# Patient Record
Sex: Male | Born: 1980 | Race: Black or African American | Hispanic: No | Marital: Married | Smoking: Never smoker
Health system: Southern US, Community
[De-identification: ages and names within clinical notes are randomized; demographics above are authoritative.]

## PROBLEM LIST (undated history)

## (undated) DIAGNOSIS — I10 Essential (primary) hypertension: Secondary | ICD-10-CM

## (undated) DIAGNOSIS — R12 Heartburn: Secondary | ICD-10-CM

## (undated) HISTORY — DX: Essential (primary) hypertension: I10

## (undated) HISTORY — DX: Heartburn: R12

## (undated) HISTORY — PX: BUNIONECTOMY: SHX129

## (undated) HISTORY — PX: SHOULDER SURGERY: SHX246

---

## 2000-11-28 ENCOUNTER — Emergency Department (HOSPITAL_COMMUNITY): Admission: EM | Admit: 2000-11-28 | Discharge: 2000-11-28 | Payer: Self-pay | Admitting: *Deleted

## 2011-10-10 ENCOUNTER — Emergency Department (HOSPITAL_COMMUNITY)
Admission: EM | Admit: 2011-10-10 | Discharge: 2011-10-10 | Disposition: A | Discharge status: Home / Self Care | Payer: PRIVATE HEALTH INSURANCE | Attending: Emergency Medicine | Admitting: Emergency Medicine

## 2011-10-10 ENCOUNTER — Encounter (HOSPITAL_COMMUNITY): Payer: Self-pay | Admitting: Emergency Medicine

## 2011-10-10 DIAGNOSIS — R197 Diarrhea, unspecified: Secondary | ICD-10-CM

## 2011-10-10 MED ORDER — DIPHENOXYLATE-ATROPINE 2.5-0.025 MG PO TABS
2.0000 | ORAL_TABLET | Freq: Once | ORAL | Status: AC
  Administered 2011-10-10: 2 via ORAL
  Filled 2011-10-10: qty 2

## 2011-10-10 NOTE — Discharge Instructions (Signed)
Drink fluids. Bland diet for the next 6-8 hours then progress as tolerated. Use the BRAT diet to help with the diarrhea.   B.R.A.T. Diet Your doctor has recommended the B.R.A.T. diet for you or your child until the condition improves. This is often used to help control diarrhea and vomiting symptoms. If you or your child can tolerate clear liquids, you may have:  Bananas.   Rice.   Applesauce.   Toast (and other simple starches such as crackers, potatoes, noodles).  Be sure to avoid dairy products, meats, and fatty foods until symptoms are better. Fruit juices such as apple, grape, and prune juice can make diarrhea worse. Avoid these. Continue this diet for 2 days or as instructed by your caregiver. Document Released: 04/04/2005 Document Revised: 03/24/2011 Document Reviewed: 09/21/2006 [facility] Patient Information 2012 [place], [place].

## 2011-10-10 NOTE — ED Notes (Signed)
States has been feeling bad since Thursday, started having diarrhea on Friday.

## 2011-10-10 NOTE — ED Provider Notes (Signed)
History     CSN: [id number]  Arrival date & time 10/10/11  4098   First MD Initiated Contact with Patient 10/10/11 [phone]      Chief Complaint  Patient presents with  . Diarrhea  . Abdominal Cramping    (Consider location/radiation/quality/duration/timing/severity/associated sxs/prior treatment) HPI  Sean Kennedy is a 31 y.o. male who presents to the Emergency Department complaining of abdominal cramping and general malaise since Thursday with the development of diarrhea on Friday. He's been having diarrhea since. He denies nausea, vomiting, fever, chills. He has been able to drink fluids. He has been eating however when he eats he has diarrhea. He has taken Imodium and Pepto-Bismol with no effect.  History reviewed. No pertinent past medical history.  History reviewed. No pertinent past surgical history.  No family history on file.  History  Substance Use Topics  . Smoking status: Never Smoker   . Smokeless tobacco: Not on file  . Alcohol Use: Yes     occasional      Review of Systems  Constitutional: Negative for fever.       10 Systems reviewed and are negative for acute change except as noted in the HPI.  HENT: Negative for congestion.   Eyes: Negative for discharge and redness.  Respiratory: Negative for cough and shortness of breath.   Cardiovascular: Negative for chest pain.  Gastrointestinal: Positive for diarrhea. Negative for vomiting and abdominal pain.  Musculoskeletal: Negative for back pain.  Skin: Negative for rash.  Neurological: Negative for syncope, numbness and headaches.  Psychiatric/Behavioral:       No behavior change.    Allergies  Review of patient's allergies indicates no known allergies.  Home Medications  No current outpatient prescriptions on file.  BP 128/68  Pulse 61  Temp 98.4 F (36.9 C) (Oral)  Resp 14  Ht 6' (1.829 m)  Wt 200 lb (90.719 kg)  BMI 27.12 kg/m2  SpO2 98%  Physical Exam  Nursing note and vitals  reviewed. Constitutional: He is oriented to person, place, and time. He appears well-developed and well-nourished. No distress.       Awake, alert, nontoxic appearance.  HENT:  Head: Normocephalic and atraumatic.  Eyes: EOM are normal. Pupils are equal, round, and reactive to light. Right eye exhibits no discharge. Left eye exhibits no discharge.  Neck: Neck supple.  Cardiovascular: Normal rate, normal heart sounds and intact distal pulses.   Pulmonary/Chest: Effort normal. He exhibits no tenderness.  Abdominal: Soft. He exhibits no distension. There is no tenderness. There is no rebound.       Bowel sounds hyperactive  Musculoskeletal: He exhibits no tenderness.       Baseline ROM, no obvious new focal weakness.  Neurological: He is alert and oriented to person, place, and time.       Mental status and motor strength appears baseline for patient and situation.  Skin: No rash noted.  Psychiatric: He has a normal mood and affect.    ED Course  Procedures (including critical care time)    MDM  Patient is a diarrhea illness since Friday. Given Lomotil. Given information about the BRAT diet. Pt stable in ED with no significant deterioration in condition.The patient appears reasonably screened and/or stabilized for discharge and I doubt any other medical condition or other [facility] requiring further screening, evaluation, or treatment in the ED at this time prior to discharge.  MDM Reviewed: nursing note and vitals  Nicoletta Dress. Colon Branch, MD 10/10/11 [phone]

## 2019-02-28 ENCOUNTER — Ambulatory Visit: Payer: Self-pay | Admitting: Physician Assistant

## 2019-03-11 ENCOUNTER — Encounter: Payer: Self-pay | Admitting: Physician Assistant

## 2019-03-11 ENCOUNTER — Ambulatory Visit (INDEPENDENT_AMBULATORY_CARE_PROVIDER_SITE_OTHER): Payer: PRIVATE HEALTH INSURANCE | Admitting: Physician Assistant

## 2019-03-11 ENCOUNTER — Other Ambulatory Visit: Payer: Self-pay

## 2019-03-11 VITALS — BP 161/110 | HR 83 | Temp 96.9°F | Ht 71.0 in | Wt 250.6 lb

## 2019-03-11 DIAGNOSIS — Z0000 Encounter for general adult medical examination without abnormal findings: Secondary | ICD-10-CM

## 2019-03-11 DIAGNOSIS — R030 Elevated blood-pressure reading, without diagnosis of hypertension: Secondary | ICD-10-CM | POA: Diagnosis not present

## 2019-03-11 DIAGNOSIS — Z23 Encounter for immunization: Secondary | ICD-10-CM | POA: Diagnosis not present

## 2019-03-11 NOTE — Progress Notes (Signed)
Patient: Sean Kennedy, Male    DOB: 04/16/1981, 38 y.o.   MRN: [id number] Visit Date: 03/11/2019  Today's Provider: Trinna Post, PA-C   Chief Complaint  Patient presents with  . New Patient (Initial Visit)   Subjective:     New Patient Appointment Sean Kennedy is a 38 y.o. male who presents today for health maintenance and complete physical/ new patient appointment. He feels well. He reports exercising not now. He reports he is sleeping fairly well. Lives with fiance, 2 kids one on the way  Alcohol every other weekend  Drugs: marijuana previously.   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  Mom, dad, and sister diagnosed with HTN. Sister is aged 18 and has had HTN x several years. He reports he has never been diagnosed with HTN previously.   Obesity: Patient reports weighing 215 lbs last year.   Wt Readings from Last 3 Encounters:  03/11/19 250 lb 9.6 oz (113.7 kg)  10/10/11 200 lb (90.7 kg)   BP Readings from Last 3 Encounters:  03/11/19 (!) 161/110  10/10/11 128/68     -----------------------------------------------------------------   Review of Systems  Constitutional: Negative.   HENT: Negative.   Eyes: Negative.   Respiratory: Negative.   Cardiovascular: Negative.   Gastrointestinal: Negative.   Endocrine: Negative.   Genitourinary: Negative.   Musculoskeletal: Negative.   Skin: Negative.   Allergic/Immunologic: Negative.   Neurological: Negative.   Hematological: Negative.   Psychiatric/Behavioral: Negative.     Social History He  reports that he has never smoked. He has never used smokeless tobacco. He reports current alcohol use. He reports that he does not use drugs. Social History   Socioeconomic History  . Marital status: Single    Spouse name: Not on file  . Number of children: Not on file  . Years of education: Not on file  . Highest education level: Not on file  Occupational History  . Not on file  Social Needs  . Financial  resource strain: Not on file  . Food insecurity    Worry: Not on file    Inability: Not on file  . Transportation needs    Medical: Not on file    Non-medical: Not on file  Tobacco Use  . Smoking status: Never Smoker  . Smokeless tobacco: Never Used  Substance and Sexual Activity  . Alcohol use: Yes    Comment: occasional  . Drug use: Never  . Sexual activity: Not on file  Lifestyle  . Physical activity    Days per week: Not on file    Minutes per session: Not on file  . Stress: Not on file  Relationships  . Social Herbalist on phone: Not on file    Gets together: Not on file    Attends religious service: Not on file    Active member of club or organization: Not on file    Attends meetings of clubs or organizations: Not on file    Relationship status: Not on file  Other Topics Concern  . Not on file  Social History Narrative  . Not on file    There are no active problems to display for this patient.   Past Surgical History:  Procedure Laterality Date  . SHOULDER SURGERY Right     Family History  Family Status  Relation Name Status  . Mother  (Not Specified)  . Father  (Not Specified)  . Sister  (Not Specified)  His family history includes Hypertension in his father, mother, and sister.     No Known Allergies  Previous Medications   No medications on file    Patient Care Team: Maryella Shivers as PCP - General (Physician Assistant)      Objective:   Vitals: BP (!) 161/110 (BP Location: Left Arm, Patient Position: Sitting, Cuff Size: Large)   Pulse 83   Temp (!) 96.9 F (36.1 C) (Temporal)   Ht 5\' 11"  (1.803 m)   Wt 250 lb 9.6 oz (113.7 kg)   BMI 34.95 kg/m    Physical Exam Constitutional:      Appearance: Normal appearance. He is obese.  Cardiovascular:     Rate and Rhythm: Normal rate.     Heart sounds: Normal heart sounds.  Pulmonary:     Effort: Pulmonary effort is normal.     Breath sounds: Normal breath sounds.   Abdominal:     General: Abdomen is flat. Bowel sounds are normal.     Palpations: Abdomen is soft.  Skin:    General: Skin is warm and dry.  Neurological:     Mental Status: He is alert and oriented to person, place, and time. Mental status is at baseline.  Psychiatric:        Mood and Affect: Mood normal.        Behavior: Behavior normal.      Depression Screen PHQ 2/9 Scores 03/11/2019  PHQ - 2 Score 0  PHQ- 9 Score 2      Assessment & Plan:     Routine Health Maintenance and Physical Exam  Exercise Activities and Dietary recommendations Goals   None      There is no immunization history on file for this patient.  Health Maintenance  Topic Date Due  . HIV Screening  01/01/1996  . TETANUS/TDAP  01/01/2000  . INFLUENZA VACCINE  07/17/2019 (Originally 11/17/2018)     Discussed health benefits of physical activity, and encouraged him to engage in regular exercise appropriate for his age and condition.    1. Annual physical exam  Will update tetanus shot today.   - Comprehensive Metabolic Panel (CMET) - CBC with Differential - TSH - Lipid Profile  2. Elevated BP without diagnosis of hypertension  Likely represents HTN. Patient wants to come back in separate visit and see if it is still elevated before starting medication. Counseled on lifestyle changes. Follow up in 1-2 months.   The entirety of the information documented in the History of Present Illness, Review of Systems and Physical Exam were personally obtained by me. Portions of this information were initially documented by [facility], CMA and reviewed by me for thoroughness and accuracy.      --------------------------------------------------------------------

## 2019-03-11 NOTE — Addendum Note (Signed)
Addended by: Dan Maker on: 03/11/2019 04:31 PM   Modules accepted: Orders

## 2019-03-11 NOTE — Patient Instructions (Signed)

## 2019-03-12 LAB — LIPID PANEL
Chol/HDL Ratio: 3.1 ratio (ref 0.0–5.0)
Cholesterol, Total: 153 mg/dL (ref 100–199)
HDL: 50 mg/dL (ref >39)
LDL Chol Calc (NIH): 91 mg/dL (ref 0–99)
Triglycerides: 56 mg/dL (ref 0–149)
VLDL Cholesterol Cal: 12 mg/dL (ref 5–40)

## 2019-03-12 LAB — CBC WITH DIFFERENTIAL/PLATELET
Basophils Absolute: 0 10*3/uL (ref 0.0–0.2)
Basos: 1 %
EOS (ABSOLUTE): 0.1 10*3/uL (ref 0.0–0.4)
Eos: 2 %
Hematocrit: 43.1 % (ref 37.5–51.0)
Hemoglobin: 14.1 g/dL (ref 13.0–17.7)
Immature Grans (Abs): 0 10*3/uL (ref 0.0–0.1)
Immature Granulocytes: 0 %
Lymphocytes Absolute: 1.6 10*3/uL (ref 0.7–3.1)
Lymphs: 31 %
MCH: 26.7 pg (ref 26.6–33.0)
MCHC: 32.7 g/dL (ref 31.5–35.7)
MCV: 82 fL (ref 79–97)
Monocytes Absolute: 0.5 10*3/uL (ref 0.1–0.9)
Monocytes: 10 %
Neutrophils Absolute: 2.8 10*3/uL (ref 1.4–7.0)
Neutrophils: 56 %
Platelets: 235 10*3/uL (ref 150–450)
RBC: 5.29 x10E6/uL (ref 4.14–5.80)
RDW: 13.9 % (ref 11.6–15.4)
WBC: 5 10*3/uL (ref 3.4–10.8)

## 2019-03-12 LAB — COMPREHENSIVE METABOLIC PANEL
ALT: 24 IU/L (ref 0–44)
AST: 29 IU/L (ref 0–40)
Albumin/Globulin Ratio: 1.7 (ref 1.2–2.2)
Albumin: 4.7 g/dL (ref 4.0–5.0)
Alkaline Phosphatase: 61 IU/L (ref 39–117)
BUN/Creatinine Ratio: 10 (ref 9–20)
BUN: 13 mg/dL (ref 6–20)
Bilirubin Total: <0.2 mg/dL (ref 0.0–1.2)
CO2: 22 mmol/L (ref 20–29)
Calcium: 9.5 mg/dL (ref 8.7–10.2)
Chloride: 101 mmol/L (ref 96–106)
Creatinine, Ser: 1.33 mg/dL — ABNORMAL HIGH (ref 0.76–1.27)
GFR calc Af Amer: 78 mL/min/{1.73_m2} (ref >59)
GFR calc non Af Amer: 67 mL/min/{1.73_m2} (ref >59)
Globulin, Total: 2.8 g/dL (ref 1.5–4.5)
Glucose: 79 mg/dL (ref 65–99)
Potassium: 4 mmol/L (ref 3.5–5.2)
Sodium: 140 mmol/L (ref 134–144)
Total Protein: 7.5 g/dL (ref 6.0–8.5)

## 2019-03-12 LAB — TSH: TSH: 2.06 u[IU]/mL (ref 0.450–4.500)

## 2019-03-27 ENCOUNTER — Encounter: Payer: Self-pay | Admitting: Physician Assistant

## 2019-03-27 ENCOUNTER — Encounter (INDEPENDENT_AMBULATORY_CARE_PROVIDER_SITE_OTHER): Payer: PRIVATE HEALTH INSURANCE | Admitting: Physician Assistant

## 2019-03-27 DIAGNOSIS — I10 Essential (primary) hypertension: Secondary | ICD-10-CM

## 2019-03-29 MED ORDER — AMLODIPINE BESYLATE 5 MG PO TABS: 5.0000 mg | ORAL_TABLET | Freq: Every day | ORAL | 0 refills | Status: DC

## 2019-03-29 NOTE — Telephone Encounter (Signed)
Subjective:   Patient ID: Sean Kennedy, male    DOB: [birth year], 38 y.o.   MRN: [id number]  Sean Kennedy is a 38 y.o. male presenting on 03/27/2019 for No chief complaint on file.  Virtual Visit via Telephone Note  I connected with Sean Kennedy on @TODAY @ at  by telephone and verified that I am speaking with the correct person using two identifiers.   I discussed the limitations, risks, security and privacy concerns of performing an evaluation and management service by telephone and the availability of in person appointments. I also discussed with the patient that there may be a patient responsible charge related to this service. The patient expressed understanding and agreed to proceed.  HPI  Patient presenting today for HTN. BP is elevated as below. He reports strong family history of HTN in his parents. He is not checking his BP at home. Denies Chest pain, SOB, dizziness, and syncope.   BP Readings from Last 3 Encounters:  03/11/19 (!) 161/110  10/10/11 128/68     Social History   Tobacco Use  . Smoking status: Never Smoker  . Smokeless tobacco: Never Used  Substance Use Topics  . Alcohol use: Yes    Comment: occasional  . Drug use: Never    ROS Per HPI unless specifically indicated above   Objective:   Wt Readings from Last 3 Encounters:  03/11/19 250 lb 9.6 oz (113.7 kg)  10/10/11 200 lb (90.7 kg)    Physical Exam Results for orders placed or performed in visit on 03/11/19  Comprehensive Metabolic Panel (CMET)  Result Value Ref Range   Glucose 79 65 - 99 mg/dL   BUN 13 6 - 20 mg/dL   Creatinine, Ser 03/13/19 (H) 0.76 - 1.27 mg/dL   GFR calc non Af Amer 67 >59 mL/min/1.73   GFR calc Af Amer 78 >59 mL/min/1.73   BUN/Creatinine Ratio 10 9 - 20   Sodium 140 134 - 144 mmol/L   Potassium 4.0 3.5 - 5.2 mmol/L   Chloride 101 96 - 106 mmol/L   CO2 22 20 - 29 mmol/L   Calcium 9.5 8.7 - 10.2 mg/dL   Total Protein 7.5 6.0 - 8.5 g/dL   Albumin 4.7 4.0 - 5.0 g/dL    Globulin, Total 2.8 1.5 - 4.5 g/dL   Albumin/Globulin Ratio 1.7 1.2 - 2.2   Bilirubin Total <0.2 0.0 - 1.2 mg/dL   Alkaline Phosphatase 61 39 - 117 IU/L   AST 29 0 - 40 IU/L   ALT 24 0 - 44 IU/L  CBC with Differential  Result Value Ref Range   WBC 5.0 3.4 - 10.8 x10E3/uL   RBC 5.29 4.14 - 5.80 x10E6/uL   Hemoglobin 14.1 13.0 - 17.7 g/dL   Hematocrit 2.99 24.2 - 51.0 %   MCV 82 79 - 97 fL   MCH 26.7 26.6 - 33.0 pg   MCHC 32.7 31.5 - 35.7 g/dL   RDW 68.3 41.9 - 62.2 %   Platelets 235 150 - 450 x10E3/uL   Neutrophils 56 Not Estab. %   Lymphs 31 Not Estab. %   Monocytes 10 Not Estab. %   Eos 2 Not Estab. %   Basos 1 Not Estab. %   Neutrophils Absolute 2.8 1.4 - 7.0 x10E3/uL   Lymphocytes Absolute 1.6 0.7 - 3.1 x10E3/uL   Monocytes Absolute 0.5 0.1 - 0.9 x10E3/uL   EOS (ABSOLUTE) 0.1 0.0 - 0.4 x10E3/uL   Basophils Absolute 0.0 0.0 - 0.2  x10E3/uL   Immature Granulocytes 0 Not Estab. %   Immature Grans (Abs) 0.0 0.0 - 0.1 x10E3/uL  TSH  Result Value Ref Range   TSH 2.060 0.450 - 4.500 uIU/mL  Lipid Profile  Result Value Ref Range   Cholesterol, Total 153 100 - 199 mg/dL   Triglycerides 56 0 - 149 mg/dL   HDL 50 >39 mg/dL   VLDL Cholesterol Cal 12 5 - 40 mg/dL   LDL Chol Calc (NIH) 91 0 - 99 mg/dL   Chol/HDL Ratio 3.1 0.0 - 5.0 ratio    Assessment & Plan:   Problem List Items Addressed This Visit    None    Visit Diagnoses    Essential hypertension    -  Primary   Relevant Medications   amLODipine (NORVASC) 5 MG tablet      Meds ordered this encounter  Medications  . amLODipine (NORVASC) 5 MG tablet    Sig: Take 1 tablet (5 mg total) by mouth daily.    Dispense:  90 tablet    Refill:  0    Order Specific Question:   Supervising Provider    Answer:   Birdie Sons [[id number]]      Follow up plan: No follow-ups on file.   Carles Collet, PA-C [facility] 03/29/2019, 12:39 PM

## 2019-04-26 ENCOUNTER — Other Ambulatory Visit: Payer: Self-pay | Admitting: Physician Assistant

## 2019-04-26 DIAGNOSIS — I10 Essential (primary) hypertension: Secondary | ICD-10-CM

## 2019-05-15 ENCOUNTER — Encounter: Payer: Self-pay | Admitting: Physician Assistant

## 2019-05-15 ENCOUNTER — Other Ambulatory Visit: Payer: Self-pay

## 2019-05-15 ENCOUNTER — Ambulatory Visit (INDEPENDENT_AMBULATORY_CARE_PROVIDER_SITE_OTHER): Payer: Commercial Managed Care - PPO | Admitting: Physician Assistant

## 2019-05-15 VITALS — BP 140/88 | Temp 97.1°F | Wt 253.4 lb

## 2019-05-15 DIAGNOSIS — Z3009 Encounter for other general counseling and advice on contraception: Secondary | ICD-10-CM | POA: Diagnosis not present

## 2019-05-15 DIAGNOSIS — I10 Essential (primary) hypertension: Secondary | ICD-10-CM | POA: Insufficient documentation

## 2019-05-15 MED ORDER — AMLODIPINE BESYLATE 10 MG PO TABS: 10.0000 mg | ORAL_TABLET | Freq: Every day | ORAL | 1 refills | Status: DC

## 2019-05-15 NOTE — Progress Notes (Signed)
Patient: Sean Kennedy Male    DOB: [birth year]   39 y.o.   MRN: [id number] Visit Date: 05/15/2019  Today's Provider: Trinna Post, PA-C   Chief Complaint  Patient presents with  . Hypertension   Subjective:     HPI  Hypertension, follow-up:  BP Readings from Last 3 Encounters:  05/15/19 140/88  03/11/19 (!) 161/110  10/10/11 128/68    He was last seen for hypertension 2 months ago.  BP at that visit was 161/110.     Management changes since that visit include start Amlodipine 5 mg. He reports excellent compliance with treatment. He is not having side effects.  He is exercising. He is not adherent to low salt diet.   Outside blood pressures are 140-150's/90s. He is experiencing none.  Patient denies none.   Cardiovascular risk factors include hypertension and male gender.  Use of agents associated with hypertension: none.     Weight trend: increasing steadily Wt Readings from Last 3 Encounters:  05/15/19 253 lb 6.4 oz (114.9 kg)  03/11/19 250 lb 9.6 oz (113.7 kg)  10/10/11 200 lb (90.7 kg)    Current diet: not asked  ------------------------------------------------------------------------   No Known Allergies   Current Outpatient Medications:  .  amLODipine (NORVASC) 5 MG tablet, Take 1 tablet (5 mg total) by mouth daily., Disp: 90 tablet, Rfl: 0  Review of Systems  Social History   Tobacco Use  . Smoking status: Never Smoker  . Smokeless tobacco: Never Used  Substance Use Topics  . Alcohol use: Yes    Comment: occasional      Objective:   BP 140/88 (BP Location: Right Arm, Patient Position: Sitting, Cuff Size: Normal)   Temp (!) 97.1 F (36.2 C) (Temporal)   Wt 253 lb 6.4 oz (114.9 kg)   BMI 35.34 kg/m  Vitals:   05/15/19 1543  BP: 140/88  Temp: (!) 97.1 F (36.2 C)  TempSrc: Temporal  Weight: 253 lb 6.4 oz (114.9 kg)  Body mass index is 35.34 kg/m.   Physical Exam Constitutional:      Appearance: Normal appearance.    Cardiovascular:     Rate and Rhythm: Normal rate and regular rhythm.     Heart sounds: Normal heart sounds.  Pulmonary:     Effort: Pulmonary effort is normal.     Breath sounds: Normal breath sounds.  Skin:    General: Skin is warm and dry.  Neurological:     Mental Status: He is alert and oriented to person, place, and time. Mental status is at baseline.  Psychiatric:        Mood and Affect: Mood normal.        Behavior: Behavior normal.      No results found for any visits on 05/15/19.     Assessment & Plan    1. Essential hypertension  Patient is doing well with amlodipine 5 mg and BP is significantly improved however it still remains borderline and so will increase amlodipine to 10 mg QD. Folow up in 3-6 months.   - amLODipine (NORVASC) 10 MG tablet; Take 1 tablet (10 mg total) by mouth daily.  Dispense: 90 tablet; Refill: 1  2. Vasectomy evaluation  - Ambulatory referral to Urology  The entirety of the information documented in the History of Present Illness, Review of Systems and Physical Exam were personally obtained by me. Portions of this information were initially documented by [facility] and reviewed by me for  thoroughness and accuracy.        Trey Sailors, PA-C  [facility]

## 2019-05-15 NOTE — Patient Instructions (Signed)

## 2019-05-27 ENCOUNTER — Other Ambulatory Visit: Payer: Self-pay | Admitting: Physician Assistant

## 2019-05-27 DIAGNOSIS — I10 Essential (primary) hypertension: Secondary | ICD-10-CM

## 2019-06-14 ENCOUNTER — Other Ambulatory Visit: Payer: Self-pay

## 2019-06-14 ENCOUNTER — Ambulatory Visit: Payer: Commercial Managed Care - PPO | Admitting: Urology

## 2019-06-14 ENCOUNTER — Encounter: Payer: Self-pay | Admitting: Urology

## 2019-06-14 VITALS — BP 175/103 | HR 102 | Ht 71.0 in | Wt 246.0 lb

## 2019-06-14 DIAGNOSIS — Z3009 Encounter for other general counseling and advice on contraception: Secondary | ICD-10-CM | POA: Diagnosis not present

## 2019-06-14 MED ORDER — DIAZEPAM 10 MG PO TABS: ORAL_TABLET | ORAL | 0 refills | Status: DC

## 2019-06-14 NOTE — Patient Instructions (Signed)

## 2019-06-14 NOTE — Progress Notes (Signed)
06/14/2019 12:07 PM   Sean Kennedy 19-Mar-1981 [id number]  Referring provider: Trey Sailors, PA-C [address] [place],  [place] [address]  Chief Complaint  Patient presents with  . VAS Consult    HPI: Sean Kennedy is a 39 y.o. male who presents today for vasectomy counseling.  He is engaged and has 3 children including a newborn.  He and his fiance desire vasectomy as a means of permanent sterilization.  He denies prior history of urologic problems and specifically denies history of chronic scrotal pain or scrotal/testicular infections.  No previous genitourinary surgery.   PMH: History reviewed. No pertinent past medical history.  Surgical History: Past Surgical History:  Procedure Laterality Date  . SHOULDER SURGERY Right     Home Medications:  Allergies as of 06/14/2019   No Known Allergies     Medication List       Accurate as of June 14, 2019 12:07 PM. If you have any questions, ask your nurse or doctor.        amLODipine 10 MG tablet Commonly known as: NORVASC Take 1 tablet (10 mg total) by mouth daily.       Allergies: No Known Allergies  Family History: Family History  Problem Relation Age of Onset  . Hypertension Mother   . Hypertension Father   . Hypertension Sister     Social History:  reports that he has never smoked. He has never used smokeless tobacco. He reports current alcohol use. He reports that he does not use drugs.   Physical Exam: BP (!) 175/103   Pulse (!) 102   Ht 5\' 11"  (1.803 m)   Wt 246 lb (111.6 kg)   BMI 34.31 kg/m   Constitutional:  Alert and oriented, No acute distress. HEENT: [place] AT, moist mucus membranes.  Trachea midline, no masses. Cardiovascular: No clubbing, cyanosis, or edema. Respiratory: Normal respiratory effort, no increased work of breathing. GU: Phallus without lesions, testes descended bilaterally without masses or tenderness.  Spermatic cord/epididymis palpably normal  bilaterally.  Vasa easily palpable. Neurologic: Grossly intact, no focal deficits, moving all 4 extremities. Psychiatric: Normal mood and affect.   Assessment & Plan:    - Undesired fertility He is interested in scheduling vasectomy. We had a long discussion about vasectomy. We specifically discussed the procedure, recovery and the risks, benefits and alternatives of vasectomy. I explained that the procedure entails removal of a segment of each vas deferens, each of which conducts sperm, and that the purpose of this procedure is to cause sterility (inability to produce children or cause pregnancy). Vasectomy is intended to be permanent and irreversible form of contraception. Options for fertility after vasectomy include vasectomy reversal, or sperm retrieval with in vitro fertilization. These options are not always successful, and they may be expensive. We discussed reversible forms of birth control such as condoms, IUD or diaphragms, as well as the option of freezing sperm in a sperm bank prior to the vasectomy procedure. We discussed the importance of avoiding strenuous exercise for four days after vasectomy, and the importance of refraining from any form of ejaculation for seven days after vasectomy. I explained that vasectomy does not produce immediate sterility so another form of contraceptive must be used until sterility is assured by having semen checked for sperm. Thus, a post vasectomy semen analysis is necessary to confirm sterility. Rarely, vasectomy must be repeated. We discussed the approximately 1 in 2,000 risk of pregnancy after vasectomy for men who have post-vasectomy semen  analysis showing absent sperm or rare non-motile sperm. Typical side effects include a small amount of oozing blood, some discomfort and mild swelling in the area of incision.  Vasectomy does not affect sexual performance, function, please, sensation, interest, desire, satisfaction, penile erection, volume of semen or  ejaculation. Other rare risks include allergy or adverse reaction to an anesthetic, testicular atrophy, hematoma, infection/abscess, prolonged tenderness of the vas deferens, pain, swelling, painful nodule or scar (called sperm granuloma) or epididymtis. We discussed chronic testicular pain syndrome. This has been reported to occur in as many as 1-2% of men and may be permanent. This can be treated with medication, small procedures or (rarely) surgery.  Rx Valium 10 mg sent to pharmacy.  Was informed he would need a driver if utilizing this medication.   Abbie Sons, [place] [address] [place], [place] [address] [phone]

## 2019-06-24 ENCOUNTER — Telehealth: Payer: Self-pay

## 2019-06-24 NOTE — Telephone Encounter (Signed)
Pt LM on triage line asking how much he would be responsible for as far as his vasectomy. Please call pt. Thanks

## 2019-06-25 NOTE — Telephone Encounter (Signed)
I called him and spoke with him yesterday he is all good

## 2019-07-06 ENCOUNTER — Ambulatory Visit: Payer: Commercial Managed Care - PPO | Attending: Internal Medicine

## 2019-07-06 DIAGNOSIS — Z23 Encounter for immunization: Secondary | ICD-10-CM

## 2019-07-06 NOTE — Progress Notes (Signed)
   Covid-19 Vaccination Clinic  Name:  MILBERN DOESCHER    MRN: [id number] DOB: 27-Jan-1981  07/06/2019  Mr. Leiner was observed post Covid-19 immunization for 15 minutes without incident. He was provided with Vaccine Information Sheet and instruction to access the V-Safe system.   Mr. Vincelette was instructed to call 911 with any severe reactions post vaccine: Marland Kitchen Difficulty breathing  . Swelling of face and throat  . A fast heartbeat  . A bad rash all over body  . Dizziness and weakness   Immunizations Administered    Name Date Dose VIS Date Route   Pfizer COVID-19 Vaccine 07/06/2019  9:09 AM 0.3 mL 03/29/2019 Intramuscular   Manufacturer: [employer], [employer]   Lot: [id number]   NDC: [address]-1

## 2019-07-10 NOTE — Progress Notes (Incomplete)
? ?  07/11/2019  ? ?Sean Kennedy ?1981-01-17 ?[id number] ? ?Referring provider: Trey Sailors, PA-C ?[address] ?[address] ?[place],  [place] [address] ? ?No chief complaint on file. ? ? ?HPI: ?39 yo white male presents today for vasectomy ? ??  ? ? ?PMH: ?No past medical history on file. ? ?Surgical History: ?Past Surgical History:  ?Procedure Laterality Date  ?? SHOULDER SURGERY Right   ? ? ?Home Medications:  ?Allergies as of 07/11/2019   ?No Known Allergies ?  ?  ?Medication List  ?  ?  ? Accurate as of July 10, 2019 10:42 PM. If you have any questions, ask your nurse or doctor.  ?  ?  ?  ?amLODipine 10 MG tablet ?Commonly known as: NORVASC ?Take 1 tablet (10 mg total) by mouth daily. ?  ?diazepam 10 MG tablet ?Commonly known as: VALIUM ?1 tab po 30 min prior to procedure ?  ?  ? ? ?Allergies: No Known Allergies ? ?Family History: ?Family History  ?Problem Relation Age of Onset  ?? Hypertension Mother   ?? Hypertension Father   ?? Hypertension Sister   ? ? ?Social History:  reports that he has never smoked. He has never used smokeless tobacco. He reports current alcohol use. He reports that he does not use drugs. ? ? ?Physical Exam: ?There were no vitals taken for this visit.  ?Constitutional:  Alert and oriented, No acute distress. ?HEENT: [place] AT, moist mucus membranes.  Trachea midline, no masses. ?Cardiovascular: No clubbing, cyanosis, or edema. ?Respiratory: Normal respiratory effort, no increased work of breathing. ?GI: Abdomen is soft, nontender, nondistended, no abdominal masses ?GU: No CVA tenderness ?Lymph: No cervical or inguinal lymphadenopathy. ?Skin: No rashes, bruises or suspicious lesions. ?Neurologic: Grossly intact, no focal deficits, moving all 4 extremities. ?Psychiatric: Normal mood and affect. ? ?Laboratory Data: ?Lab Results  ?Component Value Date  ? WBC 5.0 03/11/2019  ? HGB 14.1 03/11/2019  ? HCT 43.1 03/11/2019  ? MCV 82 03/11/2019  ? PLT 235 03/11/2019  ? ? ?Lab Results  ? Component Value Date  ? CREATININE 1.33 (H) 03/11/2019  ? ? ?

## 2019-07-11 ENCOUNTER — Other Ambulatory Visit: Payer: Self-pay

## 2019-07-11 ENCOUNTER — Ambulatory Visit (INDEPENDENT_AMBULATORY_CARE_PROVIDER_SITE_OTHER): Payer: Commercial Managed Care - PPO | Admitting: Urology

## 2019-07-11 ENCOUNTER — Encounter: Payer: Self-pay | Admitting: Urology

## 2019-07-11 VITALS — BP 160/96 | HR 108 | Ht 71.0 in | Wt 246.0 lb

## 2019-07-11 DIAGNOSIS — Z302 Encounter for sterilization: Secondary | ICD-10-CM

## 2019-07-11 HISTORY — PX: VASECTOMY: SHX75

## 2019-07-11 MED ORDER — HYDROCODONE-ACETAMINOPHEN 5-325 MG PO TABS: 1.0000 | ORAL_TABLET | Freq: Four times a day (QID) | ORAL | 0 refills | Status: DC | PRN

## 2019-07-11 NOTE — Patient Instructions (Signed)
Vasectomy, Care After °This sheet gives you information about how to care for yourself after your procedure. Your health care provider may also give you more specific instructions. If you have problems or questions, contact your health care provider. °What can I expect after the procedure? °After your procedure, it is common to have: °· Mild pain, swelling, redness, or discomfort in your scrotum. °· Some blood coming from your incisions or puncture sites for one or two days. °· Blood in your semen. °Follow these instructions at home: °Medicines ° °· Take over-the-counter and prescription medicines only as told by your health care provider. °· Avoid taking NSAIDs such as aspirin and ibuprofen, because these medicines can make bleeding worse. °Activity °· For the first 2 days after surgery, avoid physical activity and exercise that require a lot of energy. Ask your health care provider what activities are safe for you. °· Do not participate in sports or perform heavy physical labor until your pain has improved, or until your health care provider says it is okay. °· Do not ejaculate for at least 1 week after the procedure, or as long as directed. °· You may resume sexual activity 7-10 days after your procedure, or when your health care provider approves. Use a different method of birth control (contraception) until you have had test results that confirm that there is no sperm in your semen. °Scrotal support °· Use scrotal support, such as a jock strap or underwear with a supportive pouch, as needed for one week after your procedure. °· If you feel discomfort in your scrotum, you may remove the scrotal support to see if the discomfort is relieved. Sometimes scrotal support can press on the scrotum and cause or worsen discomfort. °· If your skin gets irritated, you may add some germ-free (sterile), fluffed bandages or a clean washcloth to the scrotal support. °General instructions °· Put ice on the injured area: °? Put  ice in a plastic bag. °? Place a towel between your skin and the bag. °? Leave the ice on for 20 minutes, 2-3 times a day. °· Check your incisions or puncture sites every day for signs of infection. Check for: °? Redness, swelling, or pain. °? Fluid or blood. °? Warmth. °? Pus or a bad smell. °· Leave stitches (sutures) in place. The sutures will dissolve on their own and do not need to be removed. °· Keep all follow-up visits as told by your health care provider. This is important because you will need a test to confirm that there is no sperm in your semen. Multiple ejaculations are needed to clear out sperm that were beyond the vasectomy site. You will need one test result showing that there is no sperm in your semen before you can resume unprotected sex. This may take 2-4 months after your procedure. °· Do not drive for 24 hours if you were given a sedative to help you relax. °Contact a health care provider if: °· You have redness, swelling, or more pain around your incision or puncture site, or in your scrotum area in general. °· You have bleeding from your incision or puncture site. °· You have pus or a bad smell coming from your incision or puncture site. °· You have a fever. °· Your incision or puncture site opens up. °Get help right away if: °· You develop a rash. °· You have difficulty breathing. °Summary °· After your procedure it is common to have mild pain, swelling, redness, or discomfort in your scrotum. °·   Avoid physical activity and exercise that requires a lot of energy for the first 2 days after surgery. °· Put ice on the injured area. Leave the ice on for 20 minutes, 2-3 times a day. °· Do not drive for 24 hours if you were given a sedative to help you relax. °This information is not intended to replace advice given to you by your health care provider. Make sure you discuss any questions you have with your health care provider. °Document Revised: 03/17/2017 Document Reviewed: 07/01/2016 °Elsevier  Patient Education © 2020 [employer] Inc. ° °

## 2019-07-11 NOTE — Progress Notes (Signed)
Vasectomy Procedure Note  Indications: The patient is a 39 y.o. male who presents today for elective sterilization.  He has been consented for the procedure.  He is aware of the risks and benefits. He had no additional questions.  He agrees to proceed.  He denies any other significant change since his last visit.  Pre-operative Diagnosis: Elective sterilization  Post-operative Diagnosis: Elective sterilization  Premedication: Valium 10 mg po  Surgeon: Lorin Picket C. Nickey Kloepfer, M.D  Description: The patient was prepped and draped in the standard fashion.  The right vas deferens was identified and brought superiorly to the anterior scrotal skin.  The skin and vas was then anesthetized utilizing 5 ml 1% lidocaine.  A small stab incision was made and spread with the vas dissector.  The vas was grasped utilizing the vas clamp and elevated out of the incision.  The vas was dissected free from surrounding tissue and vessels and an ~1 cm segment was excised.  The vas lumens were cauterized utilizing electrocautery.  The distal segment was buried in the surrounding sheath with a 3-0 chromic suture.  No significant bleeding was observed.  The vas ends were then dropped back into the hemiscrotum.  The skin was closed with hemostatic pressure.  An identical procedure was performed on the contralateral side.  Clean dry gauze was applied to the incision sites.  The patient tolerated the procedure well.  Complications:None  Recommendations: 1.  No lifting greater than 10 pounds or strenuousactivity for 1 week. 2.  Scrotal support for 1 week. 3.  Shower only for 1 week; may shower in the morning 4.  May resume intercourse in one week if no significant discomfort.  Continue alternate contraception for 12 weeks.  5.  Call for significant pain, swelling, redness, drainage or fever greater than 100.5. 6.  Rx hydrocodone/APAP 5/325 1-2 every 6 hours as needed for pain. 7.  Follow-up semen analysis in 12 weeks.  I,  Oluwafeyisayomi Bada, am acting as a scribe for Dr. Irineo Axon.  I have reviewed the above documentation for accuracy and completeness, and I agree with the above.   Riki Altes, MD

## 2019-07-30 ENCOUNTER — Ambulatory Visit: Payer: Commercial Managed Care - PPO | Attending: Internal Medicine

## 2019-07-30 DIAGNOSIS — Z23 Encounter for immunization: Secondary | ICD-10-CM

## 2019-07-30 NOTE — Progress Notes (Signed)
   Covid-19 Vaccination Clinic  Name:  Sean Kennedy    MRN: [id number] DOB: 08/04/1980  07/30/2019  [name] was observed post Covid-19 immunization for 15 minutes without incident. He was provided with Vaccine Information Sheet and instruction to access the V-Safe system.   [name] was instructed to call 911 with any severe reactions post vaccine: Marland Kitchen Difficulty breathing  . Swelling of face and throat  . A fast heartbeat  . A bad rash all over body  . Dizziness and weakness   Immunizations Administered    Name Date Dose VIS Date Route   Pfizer COVID-19 Vaccine 07/30/2019  4:29 PM 0.3 mL 03/29/2019 Intramuscular   Manufacturer: [employer], [employer]   Lot: [id number]   NDC: [address]-1

## 2019-09-12 ENCOUNTER — Other Ambulatory Visit: Payer: Self-pay

## 2019-09-12 ENCOUNTER — Encounter: Payer: Self-pay | Admitting: Physician Assistant

## 2019-09-12 ENCOUNTER — Ambulatory Visit: Payer: Commercial Managed Care - PPO | Admitting: Physician Assistant

## 2019-09-12 VITALS — BP 158/94 | HR 60 | Temp 96.9°F | Ht 69.0 in | Wt 233.2 lb

## 2019-09-12 DIAGNOSIS — M25519 Pain in unspecified shoulder: Secondary | ICD-10-CM | POA: Diagnosis not present

## 2019-09-12 DIAGNOSIS — I10 Essential (primary) hypertension: Secondary | ICD-10-CM

## 2019-09-12 MED ORDER — MELOXICAM 7.5 MG PO TABS: 7.5000 mg | ORAL_TABLET | Freq: Every day | ORAL | 1 refills | Status: DC

## 2019-09-12 MED ORDER — AMLODIPINE BESYLATE 10 MG PO TABS: 10.0000 mg | ORAL_TABLET | Freq: Every day | ORAL | 1 refills | Status: DC

## 2019-09-12 MED ORDER — LOSARTAN POTASSIUM-HCTZ 50-12.5 MG PO TABS: 1.0000 | ORAL_TABLET | Freq: Every day | ORAL | 0 refills | Status: DC

## 2019-09-12 NOTE — Progress Notes (Signed)
Established patient visit   Patient: Sean Kennedy   DOB: January 27, 1981   39 y.o. Male  MRN: [id number] Visit Date: 09/12/2019  Today's healthcare provider: Trinna Post, PA-C   Chief Complaint  Patient presents with  . Hypertension  I,Mariselda Badalamenti M Doylene Splinter,acting as a scribe for [employer], PA-C.,have documented all relevant documentation on the behalf of Trinna Post, PA-C,as directed by  Trinna Post, PA-C while in the presence of Trinna Post, PA-C.  Subjective    HPI Hypertension, follow-up  BP Readings from Last 3 Encounters:  09/12/19 (!) 158/94  07/11/19 (!) 160/96  06/14/19 (!) 175/103   Wt Readings from Last 3 Encounters:  09/12/19 233 lb 3.2 oz (105.8 kg)  07/11/19 246 lb (111.6 kg)  06/14/19 246 lb (111.6 kg)     He was last seen for hypertension 4 months ago.  BP at that visit was 140/88. Management since that visit includes increase Amlodipine to 10 mg QD .  He reports good compliance with treatment. He is not having side effects.  He is following a Regular diet. He is exercising. He does not smoke.  Use of agents associated with hypertension: none.   Outside blood pressures are 130's-140's/80's-90's. Symptoms: No chest pain No chest pressure  No palpitations No syncope  No dyspnea No orthopnea  No paroxysmal nocturnal dyspnea No lower extremity edema   Pertinent labs: Lab Results  Component Value Date   CHOL 153 03/11/2019   HDL 50 03/11/2019   LDLCALC 91 03/11/2019   TRIG 56 03/11/2019   CHOLHDL 3.1 03/11/2019   Lab Results  Component Value Date   NA 140 03/11/2019   K 4.0 03/11/2019   CREATININE 1.33 (H) 03/11/2019   GFRNONAA 67 03/11/2019   GFRAA 78 03/11/2019   GLUCOSE 79 03/11/2019     The ASCVD Risk score (Goff DC Jr., et al., 2013) failed to calculate for the following reasons:   The 2013 ASCVD risk score is only valid for ages 65 to 21   Patient reports right shoulder pain from old rotator cuff injury he  had in high school. Experiences pain after long days of exertion on occasion.  ---------------------------------------------------------------------------------------------------      Medications: Outpatient Medications Prior to Visit  Medication Sig  . [DISCONTINUED] amLODipine (NORVASC) 10 MG tablet Take 1 tablet (10 mg total) by mouth daily.  . diazepam (VALIUM) 10 MG tablet 1 tab po 30 min prior to procedure (Patient not taking: Reported on 09/12/2019)  . HYDROcodone-acetaminophen (NORCO/VICODIN) 5-325 MG tablet Take 1 tablet by mouth every 6 (six) hours as needed for moderate pain. (Patient not taking: Reported on 09/12/2019)   No facility-administered medications prior to visit.    Review of Systems  Constitutional: Negative.   Respiratory: Negative.   Cardiovascular: Negative.   Hematological: Negative.        Objective    BP (!) 158/94 (BP Location: Left Arm, Patient Position: Sitting, Cuff Size: Normal)   Pulse 60   Temp (!) 96.9 F (36.1 C) (Temporal)   Ht 5\' 9"  (1.753 m)   Wt 233 lb 3.2 oz (105.8 kg)   SpO2 93%   BMI 34.44 kg/m     Physical Exam Constitutional:      Appearance: Normal appearance.  Cardiovascular:     Rate and Rhythm: Normal rate and regular rhythm.     Pulses: Normal pulses.     Heart sounds: Normal heart sounds.  Pulmonary:  Effort: Pulmonary effort is normal.     Breath sounds: Normal breath sounds.  Skin:    General: Skin is warm and dry.  Neurological:     General: No focal deficit present.     Mental Status: He is alert and oriented to person, place, and time.  Psychiatric:        Mood and Affect: Mood normal.        Behavior: Behavior normal.       No results found for any visits on 09/12/19.  Assessment & Plan    1. Essential hypertension Patient's blood pressure was elevated during visit today,09/12/2019 and Losartan-HCTZ was added as below. - losartan-hydrochlorothiazide (HYZAAR) 50-12.5 MG tablet; Take 1 tablet by  mouth daily.  Dispense: 90 tablet; Refill: 0 - amLODipine (NORVASC) 10 MG tablet; Take 1 tablet (10 mg total) by mouth daily.  Dispense: 90 tablet; Refill: 1  2. Shoulder pain, unspecified chronicity, unspecified laterality Patient reports should pain due to pass injury and was prescribed Meloxicam 7.5 mg as needed. Patient was advised not to take ibuprofen with the Meloxicam. He was advised he could take Tylenol if need. - meloxicam (MOBIC) 7.5 MG tablet; Take 1 tablet (7.5 mg total) by mouth daily.  Dispense: 30 tablet; Refill: 1   Return in about 4 weeks (around 10/10/2019) for HTN.      ITrey Sailors, PA-C, have reviewed all documentation for this visit. The documentation on 09/13/19 for the exam, diagnosis, procedures, and orders are all accurate and complete.    Maryella Shivers  [facility] [phone] (phone) [phone] (fax)  [facility]

## 2019-10-11 ENCOUNTER — Other Ambulatory Visit: Payer: Self-pay

## 2019-10-15 NOTE — Progress Notes (Signed)
Established patient visit   Patient: Sean Kennedy   DOB: 11-Jan-1981   39 y.o. Male  MRN: [id number] Visit Date: 10/16/2019  Today's healthcare provider: Trinna Post, PA-C   Chief Complaint  Patient presents with  . Hypertension   Subjective    HPI Hypertension, follow-up  BP Readings from Last 3 Encounters:  10/16/19 (!) 165/116  09/12/19 (!) 158/94  07/11/19 (!) 160/96   Wt Readings from Last 3 Encounters:  10/16/19 237 lb (107.5 kg)  09/12/19 233 lb 3.2 oz (105.8 kg)  07/11/19 246 lb (111.6 kg)     He was last seen for hypertension 1 months ago.  BP at that visit was 158/94. Management since that visit includes Losartan-HCTZ was added  .  He reports poor compliance with treatment. He reports that he could not take both amlodipine and losartan-hctz due to GI issues.  He is not having side effects.  He is following a Regular diet.  He is exercising. He does not smoke.  Use of agents associated with hypertension: Decongestants.   Outside blood pressures are checked occasionally. He does mention that he takes Claritin-D occasionally. He has allergies and takes it when he does yard work.   Symptoms: No chest pain No chest pressure  No palpitations No syncope  No dyspnea No orthopnea  No paroxysmal nocturnal dyspnea No lower extremity edema   Pertinent labs: Lab Results  Component Value Date   CHOL 153 03/11/2019   HDL 50 03/11/2019   LDLCALC 91 03/11/2019   TRIG 56 03/11/2019   CHOLHDL 3.1 03/11/2019   Lab Results  Component Value Date   NA 138 10/16/2019   K 4.3 10/16/2019   CREATININE 1.32 (H) 10/16/2019   GFRNONAA 68 10/16/2019   GFRAA 79 10/16/2019   GLUCOSE 78 10/16/2019     The ASCVD Risk score (Goff DC Jr., et al., 2013) failed to calculate for the following reasons:   The 2013 ASCVD risk score is only valid for ages 71 to 34        Medications: Outpatient Medications Prior to Visit  Medication Sig  .  losartan-hydrochlorothiazide (HYZAAR) 50-12.5 MG tablet Take 1 tablet by mouth daily.  Marland Kitchen amLODipine (NORVASC) 10 MG tablet Take 1 tablet (10 mg total) by mouth daily. (Patient not taking: Reported on 10/16/2019)  . diazepam (VALIUM) 10 MG tablet 1 tab po 30 min prior to procedure (Patient not taking: Reported on 09/12/2019)  . HYDROcodone-acetaminophen (NORCO/VICODIN) 5-325 MG tablet Take 1 tablet by mouth every 6 (six) hours as needed for moderate pain. (Patient not taking: Reported on 09/12/2019)  . [DISCONTINUED] meloxicam (MOBIC) 7.5 MG tablet Take 1 tablet (7.5 mg total) by mouth daily. (Patient not taking: Reported on 10/16/2019)   No facility-administered medications prior to visit.    Review of Systems  Constitutional: Negative.   Respiratory: Negative.   Cardiovascular: Negative.   Neurological: Negative.   Hematological: Negative.       Objective    BP (!) 165/116 (BP Location: Left Arm, Patient Position: Sitting, Cuff Size: Large)   Pulse 85   Temp 98.3 F (36.8 C) (Temporal)   Ht 5' 7"  (1.702 m)   Wt 237 lb (107.5 kg)   BMI 37.12 kg/m    Physical Exam Constitutional:      Appearance: Normal appearance.  Cardiovascular:     Rate and Rhythm: Normal rate and regular rhythm.     Pulses: Normal pulses.     Heart sounds:  Normal heart sounds.  Pulmonary:     Effort: Pulmonary effort is normal.     Breath sounds: Normal breath sounds.  Skin:    General: Skin is warm and dry.  Neurological:     Mental Status: He is alert and oriented to person, place, and time. Mental status is at baseline.  Psychiatric:        Mood and Affect: Mood normal.        Behavior: Behavior normal.       Results for orders placed or performed in visit on 10/16/19  Post-Vas Sperm Evaluation,Qual  Result Value Ref Range   Volume 1.0 Not Estab. mL   Post-Vas Sperm, Qual Comment Absent  Results for orders placed or performed in visit on 10/16/19  Comprehensive metabolic panel  Result  Value Ref Range   Glucose 78 65 - 99 mg/dL   BUN 17 6 - 20 mg/dL   Creatinine, Ser 1.32 (H) 0.76 - 1.27 mg/dL   GFR calc non Af Amer 68 >59 mL/min/1.73   GFR calc Af Amer 79 >59 mL/min/1.73   BUN/Creatinine Ratio 13 9 - 20   Sodium 138 134 - 144 mmol/L   Potassium 4.3 3.5 - 5.2 mmol/L   Chloride 100 96 - 106 mmol/L   CO2 25 20 - 29 mmol/L   Calcium 9.6 8.7 - 10.2 mg/dL   Total Protein 6.9 6.0 - 8.5 g/dL   Albumin 4.3 4.0 - 5.0 g/dL   Globulin, Total 2.6 1.5 - 4.5 g/dL   Albumin/Globulin Ratio 1.7 1.2 - 2.2   Bilirubin Total 0.3 0.0 - 1.2 mg/dL   Alkaline Phosphatase 73 48 - 121 IU/L   AST 28 0 - 40 IU/L   ALT 28 0 - 44 IU/L    Assessment & Plan     1. Essential hypertension Advised that he needs to take both BP medications. Stop decongestants. C-met today and follow up in 1 month.  - Comprehensive metabolic panel   Return in about 4 weeks (around 11/13/2019) for HTN.      ITrinna Post, PA-C, have reviewed all documentation for this visit. The documentation on 10/22/19 for the exam, diagnosis, procedures, and orders are all accurate and complete.    Paulene Floor  [facility] [phone] (phone) [phone] (fax)  [place]

## 2019-10-16 ENCOUNTER — Encounter: Payer: Self-pay | Admitting: Physician Assistant

## 2019-10-16 ENCOUNTER — Other Ambulatory Visit: Payer: Commercial Managed Care - PPO

## 2019-10-16 ENCOUNTER — Ambulatory Visit: Payer: Commercial Managed Care - PPO | Admitting: Physician Assistant

## 2019-10-16 ENCOUNTER — Other Ambulatory Visit: Payer: Self-pay

## 2019-10-16 VITALS — BP 165/116 | HR 85 | Temp 98.3°F | Ht 67.0 in | Wt 237.0 lb

## 2019-10-16 DIAGNOSIS — I10 Essential (primary) hypertension: Secondary | ICD-10-CM

## 2019-10-16 DIAGNOSIS — Z302 Encounter for sterilization: Secondary | ICD-10-CM

## 2019-10-16 NOTE — Patient Instructions (Signed)
Do not take any decongestants or nasal sprays such as Afrin. This can raise your blood pressure.   May take Amlodipine and Losartan/HCTZ apart. You MUST take both medications DAILY.

## 2019-10-17 LAB — COMPREHENSIVE METABOLIC PANEL
ALT: 28 IU/L (ref 0–44)
AST: 28 IU/L (ref 0–40)
Albumin/Globulin Ratio: 1.7 (ref 1.2–2.2)
Albumin: 4.3 g/dL (ref 4.0–5.0)
Alkaline Phosphatase: 73 IU/L (ref 48–121)
BUN/Creatinine Ratio: 13 (ref 9–20)
BUN: 17 mg/dL (ref 6–20)
Bilirubin Total: 0.3 mg/dL (ref 0.0–1.2)
CO2: 25 mmol/L (ref 20–29)
Calcium: 9.6 mg/dL (ref 8.7–10.2)
Chloride: 100 mmol/L (ref 96–106)
Creatinine, Ser: 1.32 mg/dL — ABNORMAL HIGH (ref 0.76–1.27)
GFR calc Af Amer: 79 mL/min/{1.73_m2} (ref >59)
GFR calc non Af Amer: 68 mL/min/{1.73_m2} (ref >59)
Globulin, Total: 2.6 g/dL (ref 1.5–4.5)
Glucose: 78 mg/dL (ref 65–99)
Potassium: 4.3 mmol/L (ref 3.5–5.2)
Sodium: 138 mmol/L (ref 134–144)
Total Protein: 6.9 g/dL (ref 6.0–8.5)

## 2019-10-17 LAB — POST-VAS SPERM EVALUATION,QUAL: Volume: 1 mL

## 2019-10-18 ENCOUNTER — Telehealth: Payer: Self-pay | Admitting: Family Medicine

## 2019-10-18 NOTE — Telephone Encounter (Signed)
-----   Message from Riki Altes, MD sent at 10/18/2019  9:01 AM EDT ----- Semen sample showed no sperm present.  Okay to use vasectomy as primary contraception

## 2019-10-18 NOTE — Telephone Encounter (Signed)
Patient notified and voiced understanding.

## 2019-10-20 ENCOUNTER — Other Ambulatory Visit: Payer: Self-pay | Admitting: Physician Assistant

## 2019-10-20 DIAGNOSIS — M25519 Pain in unspecified shoulder: Secondary | ICD-10-CM

## 2019-10-20 NOTE — Telephone Encounter (Signed)
Requested Prescriptions  Pending Prescriptions Disp Refills  . meloxicam (MOBIC) 7.5 MG tablet [Pharmacy Med Name: MELOXICAM 7.5MG  TABLETS] 30 tablet 1    Sig: TAKE 1 TABLET(7.5 MG) BY MOUTH DAILY     Analgesics:  COX2 Inhibitors Failed - 10/20/2019 11:45 AM      Failed - Cr in normal range and within 360 days    Creatinine, Ser  Date Value Ref Range Status  10/16/2019 1.32 (H) 0.76 - 1.27 mg/dL Final         Passed - HGB in normal range and within 360 days    Hemoglobin  Date Value Ref Range Status  03/11/2019 14.1 13.0 - 17.7 g/dL Final         Passed - Patient is not pregnant      Passed - Valid encounter within last 12 months    Recent Outpatient Visits          4 days ago Essential hypertension   [facility] [place], Lavella Hammock, PA-C   1 month ago Essential hypertension   [facility] [place], Ricki Rodriguez M, [place]   5 months ago Essential hypertension   [facility] Osvaldo Angst M, [place]   7 months ago Annual physical exam   [employer], Lavella Hammock, [place]      Future Appointments            In 3 weeks Trey Sailors, PA-C [facility], [facility]

## 2019-10-22 ENCOUNTER — Ambulatory Visit: Payer: Self-pay | Admitting: Physician Assistant

## 2019-11-13 ENCOUNTER — Ambulatory Visit
Admission: RE | Admit: 2019-11-13 | Discharge: 2019-11-13 | Disposition: A | Discharge status: Home / Self Care | Payer: Commercial Managed Care - PPO | Attending: Physician Assistant | Admitting: Physician Assistant

## 2019-11-13 ENCOUNTER — Other Ambulatory Visit: Payer: Self-pay

## 2019-11-13 ENCOUNTER — Ambulatory Visit
Admission: RE | Admit: 2019-11-13 | Discharge: 2019-11-13 | Disposition: A | Discharge status: Home / Self Care | Payer: Commercial Managed Care - PPO | Source: Ambulatory Visit | Attending: Physician Assistant | Admitting: Physician Assistant

## 2019-11-13 ENCOUNTER — Ambulatory Visit (INDEPENDENT_AMBULATORY_CARE_PROVIDER_SITE_OTHER): Payer: Commercial Managed Care - PPO | Admitting: Physician Assistant

## 2019-11-13 ENCOUNTER — Encounter: Payer: Self-pay | Admitting: Physician Assistant

## 2019-11-13 VITALS — BP 136/82 | HR 83 | Temp 96.9°F | Ht 68.0 in | Wt 243.8 lb

## 2019-11-13 DIAGNOSIS — M25511 Pain in right shoulder: Secondary | ICD-10-CM | POA: Diagnosis present

## 2019-11-13 DIAGNOSIS — I10 Essential (primary) hypertension: Secondary | ICD-10-CM

## 2019-11-13 DIAGNOSIS — Z0000 Encounter for general adult medical examination without abnormal findings: Secondary | ICD-10-CM

## 2019-11-13 NOTE — Patient Instructions (Signed)
Preventive Care 19-39 Years Old, Male Preventive care refers to lifestyle choices and visits with your health care provider that can promote health and wellness. This includes:  A yearly physical exam. This is also called an annual well check.  Regular dental and eye exams.  Immunizations.  Screening for certain conditions.  Healthy lifestyle choices, such as eating a healthy diet, getting regular exercise, not using drugs or products that contain nicotine and tobacco, and limiting alcohol use. What can I expect for my preventive care visit? Physical exam Your health care provider will check:  Height and weight. These may be used to calculate body mass index (BMI), which is a measurement that tells if you are at a healthy weight.  Heart rate and blood pressure.  Your skin for abnormal spots. Counseling Your health care provider may ask you questions about:  Alcohol, tobacco, and drug use.  Emotional well-being.  Home and relationship well-being.  Sexual activity.  Eating habits.  Work and work Statistician. What immunizations do I need?  Influenza (flu) vaccine  This is recommended every year. Tetanus, diphtheria, and pertussis (Tdap) vaccine  You may need a Td booster every 10 years. Varicella (chickenpox) vaccine  You may need this vaccine if you have not already been vaccinated. Human papillomavirus (HPV) vaccine  If recommended by your health care provider, you may need three doses over 6 months. Measles, mumps, and rubella (MMR) vaccine  You may need at least one dose of MMR. You may also need a second dose. Meningococcal conjugate (MenACWY) vaccine  One dose is recommended if you are 45-76 years old and a Market researcher living in a residence hall, or if you have one of several medical conditions. You may also need additional booster doses. Pneumococcal conjugate (PCV13) vaccine  You may need this if you have certain conditions and were not  previously vaccinated. Pneumococcal polysaccharide (PPSV23) vaccine  You may need one or two doses if you smoke cigarettes or if you have certain conditions. Hepatitis A vaccine  You may need this if you have certain conditions or if you travel or work in places where you may be exposed to hepatitis A. Hepatitis B vaccine  You may need this if you have certain conditions or if you travel or work in places where you may be exposed to hepatitis B. Haemophilus influenzae type b (Hib) vaccine  You may need this if you have certain risk factors. You may receive vaccines as individual doses or as more than one vaccine together in one shot (combination vaccines). Talk with your health care provider about the risks and benefits of combination vaccines. What tests do I need? Blood tests  Lipid and cholesterol levels. These may be checked every 5 years starting at age 17.  Hepatitis C test.  Hepatitis B test. Screening   Diabetes screening. This is done by checking your blood sugar (glucose) after you have not eaten for a while (fasting).  Sexually transmitted disease (STD) testing. Talk with your health care provider about your test results, treatment options, and if necessary, the need for more tests. Follow these instructions at home: Eating and drinking   Eat a diet that includes fresh fruits and vegetables, whole grains, lean protein, and low-fat dairy products.  Take vitamin and mineral supplements as recommended by your health care provider.  Do not drink alcohol if your health care provider tells you not to drink.  If you drink alcohol: ? Limit how much you have to 0-2  drinks a day. ? Be aware of how much alcohol is in your drink. In the [place] one drink equals one 12 oz bottle of beer (355 mL), one 5 oz glass of wine (148 mL), or one 1 oz glass of hard liquor (44 mL). Lifestyle  Take daily care of your teeth and gums.  Stay active. Exercise for at least 30 minutes on 5 or  more days each week.  Do not use any products that contain nicotine or tobacco, such as cigarettes, e-cigarettes, and chewing tobacco. If you need help quitting, ask your health care provider.  If you are sexually active, practice safe sex. Use a condom or other form of protection to prevent STIs (sexually transmitted infections). What's next?  Go to your health care provider once a year for a well check visit.  Ask your health care provider how often you should have your eyes and teeth checked.  Stay up to date on all vaccines. This information is not intended to replace advice given to you by your health care provider. Make sure you discuss any questions you have with your health care provider. Document Revised: 03/29/2018 Document Reviewed: 03/29/2018 Elsevier Patient Education  2020 [employer].

## 2019-11-13 NOTE — Progress Notes (Signed)
Complete physical exam   Patient: Sean Kennedy   DOB: [birth year]   39 y.o. Male  MRN: [id number] Visit Date: 11/13/2019  Today's healthcare provider: Trey Sailors, PA-C   Chief Complaint  Patient presents with  . Annual Exam  . Shoulder Pain  I,Sean Kennedy M Sean Kennedy,acting as a scribe for Sean Sailors, PA-C.,have documented all relevant documentation on the behalf of Sean Sailors, PA-C,as directed by  Sean Sailors, PA-C while in the presence of Sean Sailors, PA-C.  Subjective    Sean Kennedy is a 39 y.o. male who presents today for a complete physical exam.  He reports consuming a general diet. The patient does not participate in regular exercise at present. He generally feels  well. He reports sleeping fairly well. He does  have additional problems to discuss today.  Shoulder Pain  The pain is present in the right shoulder. This is a recurrent problem. The current episode started more than 1 year ago. The problem occurs daily. The problem has been unchanged. The quality of the pain is described as aching. The pain is at a severity of 5/10. The pain is moderate. Associated symptoms include a limited range of motion. Pertinent negatives include no inability to bear weight, joint swelling, numbness, stiffness or tingling. The symptoms are aggravated by activity.    Patient reports that when he was in high school he had repeated dislocations of his right shoulder. He ulitmately had a surgery to repair his right shoulder but he cannot recall the surgery.  Hypertension, follow-up  BP Readings from Last 3 Encounters:  11/13/19 (!) 136/82  10/16/19 (!) 165/116  09/12/19 (!) 158/94   Wt Readings from Last 3 Encounters:  11/13/19 (!) 243 lb 12.8 oz (110.6 kg)  10/16/19 237 lb (107.5 kg)  09/12/19 233 lb 3.2 oz (105.8 kg)     He was last seen for hypertension 1 months ago.  BP at that visit was eleated. Management since that visit includes taking both amlodipine and  Hyzaar at the same time.  He reports good compliance with treatment. He is not having side effects.  He is following a Regular diet. He is not exercising. He does not smoke.  Use of agents associated with hypertension: none.   Outside blood pressures are normal. Symptoms: No chest pain No chest pressure  No palpitations No syncope  No dyspnea No orthopnea  No paroxysmal nocturnal dyspnea No lower extremity edema   Pertinent labs: Lab Results  Component Value Date   CHOL 153 03/11/2019   HDL 50 03/11/2019   LDLCALC 91 03/11/2019   TRIG 56 03/11/2019   CHOLHDL 3.1 03/11/2019   Lab Results  Component Value Date   NA 138 10/16/2019   K 4.3 10/16/2019   CREATININE 1.32 (H) 10/16/2019   GFRNONAA 68 10/16/2019   GFRAA 79 10/16/2019   GLUCOSE 78 10/16/2019     The ASCVD Risk score (Goff DC Jr., et al., 2013) failed to calculate for the following reasons:   The 2013 ASCVD risk score is only valid for ages 38 to 82   ---------------------------------------------------------------------------------------------------  No past medical history on file. Past Surgical History:  Procedure Laterality Date  . SHOULDER SURGERY Right   . VASECTOMY  07/11/2019   Social History   Socioeconomic History  . Marital status: Single    Spouse name: Not on file  . Number of children: Not on file  . Years of education: Not on file  .  Highest education level: Not on file  Occupational History  . Not on file  Tobacco Use  . Smoking status: Never Smoker  . Smokeless tobacco: Never Used  Substance and Sexual Activity  . Alcohol use: Yes    Comment: occasional  . Drug use: Never  . Sexual activity: Yes    Birth control/protection: Surgical    Comment: Vasectomy 07/11/2019  Other Topics Concern  . Not on file  Social History Narrative  . Not on file   Social Determinants of Health   Financial Resource Strain:   . Difficulty of Paying Living Expenses: Not on file  Food Insecurity:    . Worried About Programme researcher, broadcasting/film/video in the Last Year: Not on file  . Ran Out of Food in the Last Year: Not on file  Transportation Needs:   . Lack of Transportation (Medical): Not on file  . Lack of Transportation (Non-Medical): Not on file  Physical Activity:   . Days of Exercise per Week: Not on file  . Minutes of Exercise per Session: Not on file  Stress:   . Feeling of Stress : Not on file  Social Connections:   . Frequency of Communication with Friends and Family: Not on file  . Frequency of Social Gatherings with Friends and Family: Not on file  . Attends Religious Services: Not on file  . Active Member of Clubs or Organizations: Not on file  . Attends Banker Meetings: Not on file  . Marital Status: Not on file  Intimate Partner Violence:   . Fear of Current or Ex-Partner: Not on file  . Emotionally Abused: Not on file  . Physically Abused: Not on file  . Sexually Abused: Not on file   Family Status  Relation Name Status  . Mother  (Not Specified)  . Father  (Not Specified)  . Sister  (Not Specified)   Family History  Problem Relation Age of Onset  . Hypertension Mother   . Hypertension Father   . Hypertension Sister    No Known Allergies  Patient Care Team: Maryella Shivers as PCP - General (Physician Assistant)   Medications: Outpatient Medications Prior to Visit  Medication Sig  . amLODipine (NORVASC) 10 MG tablet Take 1 tablet (10 mg total) by mouth daily.  . meloxicam (MOBIC) 7.5 MG tablet TAKE 1 TABLET(7.5 MG) BY MOUTH DAILY  . [DISCONTINUED] losartan-hydrochlorothiazide (HYZAAR) 50-12.5 MG tablet Take 1 tablet by mouth daily.  . diazepam (VALIUM) 10 MG tablet 1 tab po 30 min prior to procedure (Patient not taking: Reported on 09/12/2019)  . HYDROcodone-acetaminophen (NORCO/VICODIN) 5-325 MG tablet Take 1 tablet by mouth every 6 (six) hours as needed for moderate pain. (Patient not taking: Reported on 09/12/2019)   No  facility-administered medications prior to visit.    Review of Systems  Constitutional: Negative.   HENT: Negative.   Eyes: Negative.   Respiratory: Negative.   Cardiovascular: Negative.   Gastrointestinal: Negative.   Endocrine: Negative.   Genitourinary: Negative.   Musculoskeletal: Negative.  Negative for stiffness.  Skin: Negative.   Allergic/Immunologic: Negative.   Neurological: Negative.  Negative for tingling and numbness.  Hematological: Negative.   Psychiatric/Behavioral: Negative.       Objective    BP (!) 136/82 (BP Location: Left Arm, Patient Position: Sitting, Cuff Size: Large)   Pulse 83   Temp (!) 96.9 F (36.1 C) (Temporal)   Ht 5\' 8"  (1.727 m)   Wt (!) 243 lb 12.8 oz (  110.6 kg)   SpO2 96%   BMI 37.07 kg/m    Physical Exam Constitutional:      Appearance: Normal appearance.  HENT:     Right Ear: Tympanic membrane, ear canal and external ear normal.     Left Ear: Tympanic membrane, ear canal and external ear normal.  Cardiovascular:     Rate and Rhythm: Normal rate and regular rhythm.     Pulses: Normal pulses.     Heart sounds: Normal heart sounds.  Pulmonary:     Effort: Pulmonary effort is normal.     Breath sounds: Normal breath sounds.  Abdominal:     General: Abdomen is flat. Bowel sounds are normal.     Palpations: Abdomen is soft.  Musculoskeletal:     Right shoulder: Tenderness present. Decreased range of motion.     Left shoulder: Normal.  Skin:    General: Skin is warm and dry.  Neurological:     General: No focal deficit present.     Mental Status: He is alert and oriented to person, place, and time.  Psychiatric:        Mood and Affect: Mood normal.        Behavior: Behavior normal.       Last depression screening scores PHQ 2/9 Scores 11/13/2019 03/11/2019  PHQ - 2 Score 0 0  PHQ- 9 Score 0 2   Last fall risk screening Fall Risk  11/13/2019  Falls in the past year? 0  Number falls in past yr: 0  Injury with Fall? 0     Last Audit-C alcohol use screening Alcohol Use Disorder Test (AUDIT) 11/13/2019  1. How often do you have a drink containing alcohol? 1  2. How many drinks containing alcohol do you have on a typical day when you are drinking? 0  3. How often do you have six or more drinks on one occasion? 0  AUDIT-C Score 1   A score of 3 or more in women, and 4 or more in men indicates increased risk for alcohol abuse, EXCEPT if all of the points are from question 1   No results found for any visits on 11/13/19.  Assessment & Plan    1. Annual physical exam   2. Right shoulder pain, unspecified chronicity Referral to ortho.   - DG Shoulder Right; Future  3. Essential hypertension  Well controlled Continue current medications Recheck metabolic panel F/u in 6 months     Routine Health Maintenance and Physical Exam  Exercise Activities and Dietary recommendations Goals   None     Immunization History  Administered Date(s) Administered  . PFIZER SARS-COV-2 Vaccination 07/06/2019, 07/30/2019  . Tdap 03/11/2019    Health Maintenance  Topic Date Due  . Hepatitis C Screening  Never done  . HIV Screening  Never done  . INFLUENZA VACCINE  Never done  . TETANUS/TDAP  03/10/2029  . COVID-19 Vaccine  Completed    Discussed health benefits of physical activity, and encouraged him to engage in regular exercise appropriate for his age and condition.    No follow-ups on file.     ITrey Sailors, PA-C, have reviewed all documentation for this visit. The documentation on 12/18/19 for the exam, diagnosis, procedures, and orders are all accurate and complete.    Maryella Shivers  [facility] [phone] (phone) [phone] (fax)  [facility]

## 2019-11-14 ENCOUNTER — Encounter: Payer: Self-pay | Admitting: Physician Assistant

## 2019-11-14 DIAGNOSIS — M19012 Primary osteoarthritis, left shoulder: Secondary | ICD-10-CM

## 2019-11-14 DIAGNOSIS — S43492S Other sprain of left shoulder joint, sequela: Secondary | ICD-10-CM

## 2019-11-19 ENCOUNTER — Ambulatory Visit: Payer: Commercial Managed Care - PPO | Admitting: Physician Assistant

## 2019-12-08 ENCOUNTER — Other Ambulatory Visit: Payer: Self-pay | Admitting: Physician Assistant

## 2019-12-08 DIAGNOSIS — I10 Essential (primary) hypertension: Secondary | ICD-10-CM

## 2019-12-08 NOTE — Telephone Encounter (Signed)
Requested Prescriptions  Pending Prescriptions Disp Refills  . losartan-hydrochlorothiazide (HYZAAR) 50-12.5 MG tablet [Pharmacy Med Name: LOSARTAN/HCTZ 50/12.5MG  TABLETS] 90 tablet 0    Sig: TAKE 1 TABLET BY MOUTH DAILY     Cardiovascular: ARB + Diuretic Combos Failed - 12/08/2019  3:18 AM      Failed - Cr in normal range and within 180 days    Creatinine, Ser  Date Value Ref Range Status  10/16/2019 1.32 (H) 0.76 - 1.27 mg/dL Final         Passed - K in normal range and within 180 days    Potassium  Date Value Ref Range Status  10/16/2019 4.3 3.5 - 5.2 mmol/L Final         Passed - Na in normal range and within 180 days    Sodium  Date Value Ref Range Status  10/16/2019 138 134 - 144 mmol/L Final         Passed - Ca in normal range and within 180 days    Calcium  Date Value Ref Range Status  10/16/2019 9.6 8.7 - 10.2 mg/dL Final         Passed - Patient is not pregnant      Passed - Last BP in normal range    BP Readings from Last 1 Encounters:  11/13/19 (!) 136/82         Passed - Valid encounter within last 6 months    Recent Outpatient Visits          3 weeks ago Annual physical exam   [employer], Lavella Hammock, PA-C   1 month ago Essential hypertension   [facility] [place], [place], [place]   2 months ago Essential hypertension   [facility] Os[place]do Angst [place], [place]   6 months ago Essential hypertension   [facility] Osvaldo Angst M, [place]   9 months ago Annual physical exam   [employer], Lavella Hammock, [place]      Future Appointments            In 5 months Jodi Marble, Lavella Hammock, PA-C [employer], [facility]

## 2020-01-30 ENCOUNTER — Ambulatory Visit: Payer: Commercial Managed Care - PPO

## 2020-01-30 ENCOUNTER — Ambulatory Visit: Payer: Commercial Managed Care - PPO | Attending: Internal Medicine

## 2020-01-30 DIAGNOSIS — Z23 Encounter for immunization: Secondary | ICD-10-CM

## 2020-01-30 NOTE — Progress Notes (Signed)
   Covid-19 Vaccination Clinic  Name:  Sean Kennedy    MRN: [id number] DOB: [birth year]  01/30/2020  Mr. Sean Kennedy was observed post Covid-19 immunization for 15 minutes without incident. He was provided with Vaccine Information Sheet and instruction to access the V-Safe system.   Mr. Sean Kennedy was instructed to call 911 with any severe reactions post vaccine: Marland Kitchen Difficulty breathing  . Swelling of face and throat  . A fast heartbeat  . A bad rash all over body  . Dizziness and weakness

## 2020-03-02 ENCOUNTER — Other Ambulatory Visit: Payer: Self-pay | Admitting: Physician Assistant

## 2020-03-02 DIAGNOSIS — I10 Essential (primary) hypertension: Secondary | ICD-10-CM

## 2020-04-20 ENCOUNTER — Ambulatory Visit (INDEPENDENT_AMBULATORY_CARE_PROVIDER_SITE_OTHER): Payer: BC Managed Care – PPO | Admitting: Family Medicine

## 2020-04-20 ENCOUNTER — Encounter (INDEPENDENT_AMBULATORY_CARE_PROVIDER_SITE_OTHER): Payer: Self-pay | Admitting: Family Medicine

## 2020-04-20 ENCOUNTER — Other Ambulatory Visit: Payer: Self-pay

## 2020-04-20 VITALS — BP 121/81 | HR 86 | Temp 98.5°F | Ht 69.0 in | Wt 267.0 lb

## 2020-04-20 DIAGNOSIS — Z9189 Other specified personal risk factors, not elsewhere classified: Secondary | ICD-10-CM | POA: Diagnosis not present

## 2020-04-20 DIAGNOSIS — Z1331 Encounter for screening for depression: Secondary | ICD-10-CM | POA: Diagnosis not present

## 2020-04-20 DIAGNOSIS — R0602 Shortness of breath: Secondary | ICD-10-CM

## 2020-04-20 DIAGNOSIS — I10 Essential (primary) hypertension: Secondary | ICD-10-CM

## 2020-04-20 DIAGNOSIS — Z6839 Body mass index (BMI) 39.0-39.9, adult: Secondary | ICD-10-CM

## 2020-04-20 DIAGNOSIS — Z0289 Encounter for other administrative examinations: Secondary | ICD-10-CM

## 2020-04-20 DIAGNOSIS — R5383 Other fatigue: Secondary | ICD-10-CM | POA: Diagnosis not present

## 2020-04-20 NOTE — Progress Notes (Signed)
Chief Complaint:   OBESITY Sean Kennedy (MR# [id number]) is a 40 y.o. male who presents for evaluation and treatment of obesity and related comorbidities. Current BMI is Body mass index is 39.43 kg/m. Sean Kennedy has been struggling with his weight for many years and has been unsuccessful in either losing weight, maintaining weight loss, or reaching his healthy weight goal.  Sean Kennedy is currently in the action stage of change and ready to dedicate time achieving and maintaining a healthier weight. Sean Kennedy is interested in becoming our patient and working on intensive lifestyle modifications including (but not limited to) diet and exercise for weight loss.  Sean Kennedy heard about the clinic from his wife.  He eats out once per day.  Skips lunch daily.  He has 4 children, who are picky eaters.  In the morning, he may or may not have breakfast consisting of a [place] chicken and cheese biscuit with hash brown and coffee (french vanilla cold brew) and feels satisfied.  No hunger or snacking.  For dinner, [employer] chicken sandwich with or without fries or at home, chicken (8 ounce) with gravy and 2 cups of rice and broccoli (each).  Sean Kennedy's habits were reviewed today and are as follows: His family eats meals together, he thinks his family will eat healthier with him, his desired weight loss is 57 pounds, he started gaining weight 1 year ago, his heaviest weight ever was 280 pounds, he skips lunch frequently, he frequently makes poor food choices and he frequently eats larger portions than normal.  Depression Screen Sean Kennedy's Food and Mood (modified PHQ-9) score was 5.  Depression screen PHQ 2/9 04/20/2020  Decreased Interest 1  Down, Depressed, Hopeless 1  PHQ - 2 Score 2  Altered sleeping 1  Tired, decreased energy 2  Change in appetite 0  Feeling bad or failure about yourself  0  Trouble concentrating 0  Moving slowly or fidgety/restless 0  Suicidal thoughts 0  PHQ-9 Score 5  Difficult doing  work/chores -   Subjective:   1. Other fatigue Sean Kennedy denies daytime somnolence and reports waking up still tired. Patent has a history of symptoms of morning fatigue and snoring. Sean Kennedy generally gets 6 hours of sleep per night, and states that he has poor quality sleep. Snoring is present. Apneic episodes are not present. Epworth Sleepiness Score is 4.  2. SOB (shortness of breath) on exertion Sean Kennedy notes increasing shortness of breath with exercising and seems to be worsening over time with weight gain. He notes getting out of breath sooner with activity than he used to. This has not gotten worse recently. Sean Kennedy denies shortness of breath at rest or orthopnea.  3. Essential hypertension Review: taking medications as instructed, no medication side effects noted, no chest pain on exertion, no dyspnea on exertion, no swelling of ankles.  He was diagnosed a few years ago.  He was on amlodipine initially and then losartan-HCTZ was added.  BP Readings from Last 3 Encounters:  04/20/20 121/81  11/13/19 (!) 136/82  10/16/19 (!) 165/116   4. Depression screening Sean Kennedy was screened for depression today as part of his new patient workup.  PHQ-9 is 5.  5. At risk for heart disease Sonnie is at a higher than average risk for cardiovascular disease due to obesity.   Assessment/Plan:   1. Other fatigue Sean Kennedy does feel that his weight is causing his energy to be lower than it should be. Fatigue may be related to obesity, depression or many other causes.  Labs will be ordered, and in the meanwhile, Sean Kennedy will focus on self care including making healthy food choices, increasing physical activity and focusing on stress reduction.  - EKG 12-Lead - Vitamin B12 - Folate - Hemoglobin A1c - Insulin, random - VITAMIN D 25 Hydroxy (Vit-D Deficiency, Fractures) - TSH - T4 - T3  2. SOB (shortness of breath) on exertion Sean Kennedy does not feel that he gets out of breath more easily that he used to when he  exercises. Sean Kennedy's shortness of breath appears to be obesity related and exercise induced. He has agreed to work on weight loss and gradually increase exercise to treat his exercise induced shortness of breath. Will continue to monitor closely.  Referral to Cardiology has been placed today.  - CBC with Differential/Platelet - Lipid Panel With LDL/HDL Ratio - Ambulatory referral to Cardiology  3. Essential hypertension Sean Kennedy is working on healthy weight loss and exercise to improve blood pressure control. We will watch for signs of hypotension as he continues his lifestyle modifications.  Will check CMP, EKG, and FLP today.  - Comprehensive metabolic panel - Ambulatory referral to Cardiology  4. Depression screening Sean Kennedy's depression screening was negative today.  5. At risk for heart disease Sean Kennedy was given approximately 15 minutes of coronary artery disease prevention counseling today. He is 40 y.o. male and has risk factors for heart disease including obesity. We discussed intensive lifestyle modifications today with an emphasis on specific weight loss instructions and strategies.   Repetitive spaced learning was employed today to elicit superior memory formation and behavioral change.  6. Class 2 severe obesity with serious comorbidity and body mass index (BMI) of 39.0 to 39.9 in adult, unspecified obesity type (HCC)  Sean Kennedy is currently in the action stage of change and his goal is to continue with weight loss efforts. I recommend Sean Kennedy begin the structured treatment plan as follows:  He has agreed to the Category 4 Plan.  Exercise goals: No exercise has been prescribed at this time.   Behavioral modification strategies: increasing lean protein intake, meal planning and cooking strategies, keeping healthy foods in the home and planning for success.  He was informed of the importance of frequent follow-up visits to maximize his success with intensive lifestyle modifications for his  multiple health conditions. He was informed we would discuss his lab results at his next visit unless there is a critical issue that needs to be addressed sooner. Sean Kennedy agreed to keep his next visit at the agreed upon time to discuss these results.  Objective:   Blood pressure 121/81, pulse 86, temperature 98.5 F (36.9 C), temperature source Oral, height 5\' 9"  (1.753 m), weight 267 lb (121.1 kg). Body mass index is 39.43 kg/m.  EKG: T-wave inversions in V4-V6,, II, II, avF.   Indirect Calorimeter completed today shows a VO2 of 360 and a REE of 2511.  His calculated basal metabolic rate is thus his basal metabolic rate is better than expected.  General: Cooperative, alert, well developed, in no acute distress. HEENT: Conjunctivae and lids unremarkable. Cardiovascular: Regular rhythm.  Lungs: Normal work of breathing. Neurologic: No focal deficits.   Lab Results  Component Value Date   CREATININE 1.32 (H) 10/16/2019   BUN 17 10/16/2019   NA 138 10/16/2019   K 4.3 10/16/2019   CL 100 10/16/2019   CO2 25 10/16/2019   Lab Results  Component Value Date   ALT 28 10/16/2019   AST 28 10/16/2019   ALKPHOS 73 10/16/2019  BILITOT 0.3 10/16/2019   Lab Results  Component Value Date   TSH 2.060 03/11/2019   Lab Results  Component Value Date   CHOL 153 03/11/2019   HDL 50 03/11/2019   LDLCALC 91 03/11/2019   TRIG 56 03/11/2019   CHOLHDL 3.1 03/11/2019   Lab Results  Component Value Date   WBC 5.0 03/11/2019   HGB 14.1 03/11/2019   HCT 43.1 03/11/2019   MCV 82 03/11/2019   PLT 235 03/11/2019   Attestation Statements:   Reviewed by clinician on day of visit: allergies, medications, problem list, medical history, surgical history, family history, social history, and previous encounter notes.  This is the patient's first visit at [facility]. The patient's NEW PATIENT PACKET was reviewed at length. Included in the packet: current and past health history,  medications, allergies, ROS, gynecologic history (women only), surgical history, family history, social history, weight history, weight loss surgery history (for those that have had weight loss surgery), nutritional evaluation, mood and food questionnaire, PHQ9, Epworth questionnaire, sleep habits questionnaire, patient life and health improvement goals questionnaire. These will all be scanned into the patient's chart under media.   During the visit, I independently reviewed the patient's EKG, bioimpedance scale results, and indirect calorimeter results. I used this information to tailor a meal plan for the patient that will help him to lose weight and will improve his obesity-related conditions going forward. I performed a medically necessary appropriate examination and/or evaluation. I discussed the assessment and treatment plan with the patient. The patient was provided an opportunity to ask questions and all were answered. The patient agreed with the plan and demonstrated an understanding of the instructions. Labs were ordered at this visit and will be reviewed at the next visit unless more critical results need to be addressed immediately. Clinical information was updated and documented in the EMR.   Time spent on visit including pre-visit chart review and post-visit care was 45 minutes.   A separate 15 minutes was spent on risk counseling (see above).   I, Water quality scientist, CMA, am acting as transcriptionist for Coralie Common, MD.  I have reviewed the above documentation for accuracy and completeness, and I agree with the above. - Jinny Blossom, MD

## 2020-04-21 LAB — COMPREHENSIVE METABOLIC PANEL
ALT: 33 IU/L (ref 0–44)
AST: 29 IU/L (ref 0–40)
Albumin/Globulin Ratio: 2.1 (ref 1.2–2.2)
Albumin: 4.8 g/dL (ref 4.0–5.0)
Alkaline Phosphatase: 70 IU/L (ref 44–121)
BUN/Creatinine Ratio: 8 — ABNORMAL LOW (ref 9–20)
BUN: 10 mg/dL (ref 6–20)
Bilirubin Total: 0.3 mg/dL (ref 0.0–1.2)
CO2: 24 mmol/L (ref 20–29)
Calcium: 9.6 mg/dL (ref 8.7–10.2)
Chloride: 99 mmol/L (ref 96–106)
Creatinine, Ser: 1.27 mg/dL (ref 0.76–1.27)
GFR calc Af Amer: 82 mL/min/{1.73_m2} (ref >59)
GFR calc non Af Amer: 71 mL/min/{1.73_m2} (ref >59)
Globulin, Total: 2.3 g/dL (ref 1.5–4.5)
Glucose: 92 mg/dL (ref 65–99)
Potassium: 3.9 mmol/L (ref 3.5–5.2)
Sodium: 138 mmol/L (ref 134–144)
Total Protein: 7.1 g/dL (ref 6.0–8.5)

## 2020-04-21 LAB — LIPID PANEL WITH LDL/HDL RATIO
Cholesterol, Total: 184 mg/dL (ref 100–199)
HDL: 50 mg/dL (ref >39)
LDL Chol Calc (NIH): 118 mg/dL — ABNORMAL HIGH (ref 0–99)
LDL/HDL Ratio: 2.4 ratio (ref 0.0–3.6)
Triglycerides: 86 mg/dL (ref 0–149)
VLDL Cholesterol Cal: 16 mg/dL (ref 5–40)

## 2020-04-21 LAB — CBC WITH DIFFERENTIAL/PLATELET
Basophils Absolute: 0 10*3/uL (ref 0.0–0.2)
Basos: 1 %
EOS (ABSOLUTE): 0.1 10*3/uL (ref 0.0–0.4)
Eos: 2 %
Hematocrit: 45.3 % (ref 37.5–51.0)
Hemoglobin: 15.2 g/dL (ref 13.0–17.7)
Immature Grans (Abs): 0 10*3/uL (ref 0.0–0.1)
Immature Granulocytes: 1 %
Lymphocytes Absolute: 1 10*3/uL (ref 0.7–3.1)
Lymphs: 27 %
MCH: 26.9 pg (ref 26.6–33.0)
MCHC: 33.6 g/dL (ref 31.5–35.7)
MCV: 80 fL (ref 79–97)
Monocytes Absolute: 0.4 10*3/uL (ref 0.1–0.9)
Monocytes: 10 %
Neutrophils Absolute: 2.2 10*3/uL (ref 1.4–7.0)
Neutrophils: 59 %
Platelets: 236 10*3/uL (ref 150–450)
RBC: 5.66 x10E6/uL (ref 4.14–5.80)
RDW: 13.8 % (ref 11.6–15.4)
WBC: 3.7 10*3/uL (ref 3.4–10.8)

## 2020-04-21 LAB — HEMOGLOBIN A1C
Est. average glucose Bld gHb Est-mCnc: 120 mg/dL
Hgb A1c MFr Bld: 5.8 % — ABNORMAL HIGH (ref 4.8–5.6)

## 2020-04-21 LAB — T4: T4, Total: 6.4 ug/dL (ref 4.5–12.0)

## 2020-04-21 LAB — VITAMIN D 25 HYDROXY (VIT D DEFICIENCY, FRACTURES): Vit D, 25-Hydroxy: 20.1 ng/mL — ABNORMAL LOW (ref 30.0–100.0)

## 2020-04-21 LAB — TSH: TSH: 1.55 u[IU]/mL (ref 0.450–4.500)

## 2020-04-21 LAB — FOLATE: Folate: 9.2 ng/mL (ref >3.0)

## 2020-04-21 LAB — T3: T3, Total: 130 ng/dL (ref 71–180)

## 2020-04-21 LAB — VITAMIN B12: Vitamin B-12: 601 pg/mL (ref 232–1245)

## 2020-04-21 LAB — INSULIN, RANDOM: INSULIN: 10.3 u[IU]/mL (ref 2.6–24.9)

## 2020-04-23 DIAGNOSIS — M25511 Pain in right shoulder: Secondary | ICD-10-CM | POA: Diagnosis not present

## 2020-04-26 NOTE — Progress Notes (Signed)
Cardiology Office Note:    Date:  04/27/2020   ID:  [name], DOB 07/01/1980, MRN [id number]  PCP:  Trey Sailors, PA-C  Cardiologist:  No primary care provider on file.  Electrophysiologist:  None   Referring MD: Langston Reusing, MD   Chief Complaint  Patient presents with  . Hypertension    History of Present Illness:    Sean Kennedy is a 40 y.o. male with a hx of hypertension who is referred by Dr. Lawson Radar for evaluation of dyspnea on exertion.  He denies having any chest pain or dyspnea.  States that up until 1 year ago would work, on the treadmill for 30 to 60 minutes and denied any exertional symptoms.  More recently most exertion he has done his walking, reports he walks twice per week for about 30 minutes.  He denies any exertional symptoms.  Denies any lightheadedness, syncope, lower extremity edema, or palpitations.  Has been told he snores.  He reports he was an occasional smoker, started vaping 2 years ago, now vapes 10 times per day.  No history of heart disease in his immediate family  Past Medical History:  Diagnosis Date  . Heartburn   . Hypertension     Past Surgical History:  Procedure Laterality Date  . SHOULDER SURGERY Right   . VASECTOMY  07/11/2019    Current Medications: Current Meds  Medication Sig  . amLODipine (NORVASC) 10 MG tablet Take 1 tablet (10 mg total) by mouth daily.  Marland Kitchen losartan-hydrochlorothiazide (HYZAAR) 50-12.5 MG tablet TAKE 1 TABLET BY MOUTH DAILY     Allergies:   Patient has no known allergies.   Social History   Socioeconomic History  . Marital status: Married    Spouse name: Not on file  . Number of children: Not on file  . Years of education: Not on file  . Highest education level: Not on file  Occupational History  . Not on file  Tobacco Use  . Smoking status: Never Smoker  . Smokeless tobacco: Never Used  Substance and Sexual Activity  . Alcohol use: Yes    Comment: occasional  . Drug use: Never   . Sexual activity: Yes    Birth control/protection: Surgical    Comment: Vasectomy 07/11/2019  Other Topics Concern  . Not on file  Social History Narrative  . Not on file   Social Determinants of Health   Financial Resource Strain: Not on file  Food Insecurity: Not on file  Transportation Needs: Not on file  Physical Activity: Not on file  Stress: Not on file  Social Connections: Not on file     Family History: The patient's family history includes Alcoholism in his father; Drug abuse in his father; Hypertension in his father, mother, and sister.  ROS:   Please see the history of present illness.     All other systems reviewed and are negative.  EKGs/Labs/Other Studies Reviewed:    The following studies were reviewed today:   EKG:  EKG is ordered today.  The ekg ordered today demonstrates normal sinus rhythm, rate 93, nonspecific T wave flattening  Recent Labs: 04/20/2020: ALT 33; BUN 10; Creatinine, Ser 1.27; Hemoglobin 15.2; Platelets 236; Potassium 3.9; Sodium 138; TSH 1.550  Recent Lipid Panel    Component Value Date/Time   CHOL 184 04/20/2020 1033   TRIG 86 04/20/2020 1033   HDL 50 04/20/2020 1033   CHOLHDL 3.1 03/11/2019 1620   LDLCALC 118 (H) 04/20/2020 1033  Physical Exam:    VS:  BP 132/76   Pulse 93   Ht 5\' 10"  (1.778 m)   Wt 272 lb 9.6 oz (123.7 kg)   BMI 39.11 kg/m     Wt Readings from Last 3 Encounters:  04/27/20 272 lb 9.6 oz (123.7 kg)  04/20/20 267 lb (121.1 kg)  11/13/19 (!) 243 lb 12.8 oz (110.6 kg)     GEN: Well nourished, well developed in no acute distress HEENT: Normal NECK: No JVD; No carotid bruits LYMPHATICS: No lymphadenopathy CARDIAC: RRR, no murmurs, rubs, gallops RESPIRATORY:  Clear to auscultation without rales, wheezing or rhonchi  ABDOMEN: Soft, non-tender, non-distended MUSCULOSKELETAL:  No edema; No deformity  SKIN: Warm and dry NEUROLOGIC:  Alert and oriented x 3 PSYCHIATRIC:  Normal affect   ASSESSMENT:     1. Essential hypertension   2. Tobacco use   3. Snoring    PLAN:     Hypertension: On amlodipine 10 mg daily and losartan- HCTZ 50-12.5 mg daily.  Appears controlled.  Tobacco use: No currently smoking but has been vaping.  Patient counseled risk of vaping and cessation strongly recommended  Snoring: Recommended sleep study to rule out OSA, but patient declines at this time   RTC in 1 year    Medication Adjustments/Labs and Tests Ordered: Current medicines are reviewed at length with the patient today.  Concerns regarding medicines are outlined above.  Orders Placed This Encounter  Procedures  . EKG 12-Lead   No orders of the defined types were placed in this encounter.   Patient Instructions  Medication Instructions:  Your physician recommends that you continue on your current medications as directed. Please refer to the Current Medication list given to you today.  Follow-Up: At [facility], you and your health needs are our priority.  As part of our continuing mission to provide you with exceptional heart care, we have created designated Provider Care Teams.  These Care Teams include your primary Cardiologist (physician) and Advanced Practice Providers (APPs -  Physician Assistants and Nurse Practitioners) who all work together to provide you with the care you need, when you need it.  We recommend signing up for the patient portal called "MyChart".  Sign up information is provided on this After Visit Summary.  MyChart is used to connect with patients for Virtual Visits (Telemedicine).  Patients are able to view lab/test results, encounter notes, upcoming appointments, etc.  Non-urgent messages can be sent to your provider as well.   To learn more about what you can do with MyChart, go to [facility].    Your next appointment:   12 month(s)  The format for your next appointment:   In Person  Provider:   [email or web address], MD        Signed, Epifanio Lesches, MD  04/27/2020 1:47 PM    [facility]

## 2020-04-27 ENCOUNTER — Encounter: Payer: Self-pay | Admitting: Cardiology

## 2020-04-27 ENCOUNTER — Other Ambulatory Visit: Payer: Self-pay

## 2020-04-27 ENCOUNTER — Ambulatory Visit (INDEPENDENT_AMBULATORY_CARE_PROVIDER_SITE_OTHER): Payer: BC Managed Care – PPO | Admitting: Cardiology

## 2020-04-27 VITALS — BP 132/76 | HR 93 | Ht 70.0 in | Wt 272.6 lb

## 2020-04-27 DIAGNOSIS — I10 Essential (primary) hypertension: Secondary | ICD-10-CM

## 2020-04-27 DIAGNOSIS — Z720 Tobacco use: Secondary | ICD-10-CM | POA: Diagnosis not present

## 2020-04-27 DIAGNOSIS — R0683 Snoring: Secondary | ICD-10-CM | POA: Diagnosis not present

## 2020-04-27 NOTE — Patient Instructions (Signed)

## 2020-04-28 DIAGNOSIS — M25511 Pain in right shoulder: Secondary | ICD-10-CM | POA: Diagnosis not present

## 2020-04-30 DIAGNOSIS — M25511 Pain in right shoulder: Secondary | ICD-10-CM | POA: Diagnosis not present

## 2020-05-04 ENCOUNTER — Telehealth (INDEPENDENT_AMBULATORY_CARE_PROVIDER_SITE_OTHER): Payer: BC Managed Care – PPO | Admitting: Family Medicine

## 2020-05-04 ENCOUNTER — Encounter (INDEPENDENT_AMBULATORY_CARE_PROVIDER_SITE_OTHER): Payer: Self-pay | Admitting: Family Medicine

## 2020-05-04 ENCOUNTER — Telehealth (INDEPENDENT_AMBULATORY_CARE_PROVIDER_SITE_OTHER): Payer: Self-pay

## 2020-05-04 ENCOUNTER — Other Ambulatory Visit: Payer: Self-pay

## 2020-05-04 DIAGNOSIS — R7303 Prediabetes: Secondary | ICD-10-CM

## 2020-05-04 DIAGNOSIS — E7849 Other hyperlipidemia: Secondary | ICD-10-CM | POA: Diagnosis not present

## 2020-05-04 DIAGNOSIS — I10 Essential (primary) hypertension: Secondary | ICD-10-CM

## 2020-05-04 DIAGNOSIS — E559 Vitamin D deficiency, unspecified: Secondary | ICD-10-CM

## 2020-05-04 DIAGNOSIS — Z6839 Body mass index (BMI) 39.0-39.9, adult: Secondary | ICD-10-CM

## 2020-05-04 MED ORDER — VITAMIN D (ERGOCALCIFEROL) 1.25 MG (50000 UNIT) PO CAPS: 50000.0000 [IU] | ORAL_CAPSULE | ORAL | 0 refills | Status: DC

## 2020-05-04 NOTE — Telephone Encounter (Signed)
I connected with  Sean Kennedy on 05/04/20 by a video enabled telemedicine application and verified that I am speaking with the correct person using two identifiers.   I discussed the limitations of evaluation and management by telemedicine. The patient expressed understanding and agreed to proceed.

## 2020-05-05 ENCOUNTER — Other Ambulatory Visit (INDEPENDENT_AMBULATORY_CARE_PROVIDER_SITE_OTHER): Payer: Self-pay | Admitting: Family Medicine

## 2020-05-05 DIAGNOSIS — E559 Vitamin D deficiency, unspecified: Secondary | ICD-10-CM

## 2020-05-05 NOTE — Telephone Encounter (Signed)
This patient was last seen by Dr. Lawson Radar, and currently has an upcoming appt scheduled on 05/18/20 with her.

## 2020-05-06 NOTE — Progress Notes (Signed)
TeleHealth Visit:  Due to the COVID-19 pandemic, this visit was completed with telemedicine (audio/video) technology to reduce patient and provider exposure as well as to preserve personal protective equipment.   Sean Kennedy has verbally consented to this TeleHealth visit. The patient is located at home, the provider is located at the [facility] office. The participants in this visit include the listed provider and patient. The visit was conducted today via MyChart video.  Chief Complaint: OBESITY Sean Kennedy is here to discuss his progress with his obesity treatment plan along with follow-up of his obesity related diagnoses. Sean Kennedy is on the Category 4 Plan and states he is following his eating plan approximately 85% of the time. Sean Kennedy states he is not exercising regularly at this time.  Today's visit was #: 2 Starting weight: 267 lbs Starting date: 04/20/2020  Interim History: Sean Kennedy reports a weight of 262 pounds for the past couple of days.  He denies any hunger.  No issues with the type of food on the meal plan.  He says his children are interested in burgers and take out.  He has been doing protein shake and apples for snack calories.  He is not using all of his snack calories.  He says he is starting his new job on January 24th.  His states that his biggest obstacle is cooking everyday.  Subjective:   1. Vitamin D deficiency New.  Discussed labs with patient today.  Sean Kennedy's Vitamin D level was 20.1 on 04/20/2020. He is currently taking no vitamin D supplement.   2. Essential hypertension He is on Norvasc and Hyzaar.  Review: taking medications as instructed, no medication side effects noted, no chest pain, chest pressure, or headache.   BP Readings from Last 3 Encounters:  04/27/20 132/76  04/20/20 121/81  11/13/19 (!) 136/82   3. Prediabetes New.  Discussed labs with patient today.  Sean Kennedy has a diagnosis of prediabetes based on his elevated HgA1c and was informed this puts  him at greater risk of developing diabetes. He continues to work on diet and exercise to decrease his risk of diabetes. He denies nausea or hypoglycemia.  He is not on medications.  He endorses carb cravings in the form of bread.  Lab Results  Component Value Date   HGBA1C 5.8 (H) 04/20/2020   Lab Results  Component Value Date   INSULIN 10.3 04/20/2020   4. Other hyperlipidemia New.  Discussed labs with patient today.  Sean Kennedy has hyperlipidemia and has been trying to improve his cholesterol levels with intensive lifestyle modification including a low saturated fat diet, exercise and weight loss. He denies any chest pain, claudication or myalgias.  He is not on a statin.  He has history of hypertension.  Lab Results  Component Value Date   ALT 33 04/20/2020   AST 29 04/20/2020   ALKPHOS 70 04/20/2020   BILITOT 0.3 04/20/2020   Lab Results  Component Value Date   CHOL 184 04/20/2020   HDL 50 04/20/2020   LDLCALC 118 (H) 04/20/2020   TRIG 86 04/20/2020   CHOLHDL 3.1 03/11/2019   Assessment/Plan:   1. Vitamin D deficiency Low Vitamin D level contributes to fatigue and are associated with obesity, breast, and colon cancer. He agrees to start to take prescription Vitamin D @50 ,000 IU every week and will follow-up for routine testing of Vitamin D, at least 2-3 times per year to avoid over-replacement.  -Start Vitamin D, Ergocalciferol, (DRISDOL) 1.25 MG (50000 UNIT) CAPS capsule; Take  1 capsule (50,000 Units total) by mouth every 7 (seven) days.  Dispense: 4 capsule; Refill: 0  2. Essential hypertension Sean Kennedy is working on healthy weight loss and exercise to improve blood pressure control. We will watch for signs of hypotension as he continues his lifestyle modifications.  Continue current medications with no change in dose.  3. Prediabetes Sean Kennedy will continue to work on weight loss, exercise, and decreasing simple carbohydrates to help decrease the risk of diabetes.  Will check labs  in 3 months.  4. Other hyperlipidemia Cardiovascular risk and specific lipid/LDL goals reviewed.  We discussed several lifestyle modifications today and Sean Kennedy will continue to work on diet, exercise and weight loss efforts. Orders and follow up as documented in patient record.  Follow-up labs in 3 months.  Not at goal yet.   Counseling Intensive lifestyle modifications are the first line treatment for this issue. . Dietary changes: Increase soluble fiber. Decrease simple carbohydrates. . Exercise changes: Moderate to vigorous-intensity aerobic activity 150 minutes per week if tolerated. . Lipid-lowering medications: see documented in medical record.  5. Class 2 severe obesity with serious comorbidity and body mass index (BMI) of 39.0 to 39.9 in adult, unspecified obesity type (HCC)  Sean Kennedy is currently in the action stage of change. As such, his goal is to continue with weight loss efforts. He has agreed to the Category 4 Plan.   Exercise goals: No exercise has been prescribed at this time.  Behavioral modification strategies: increasing lean protein intake, meal planning and cooking strategies, keeping healthy foods in the home, better snacking choices and planning for success.  Sean Kennedy has agreed to follow-up with our clinic in 2 weeks. He was informed of the importance of frequent follow-up visits to maximize his success with intensive lifestyle modifications for his multiple health conditions.  Objective:   VITALS: Per patient if applicable, see vitals. GENERAL: Alert and in no acute distress. CARDIOPULMONARY: No increased WOB. Speaking in clear sentences.  PSYCH: Pleasant and cooperative. Speech normal rate and rhythm. Affect is appropriate. Insight and judgement are appropriate. Attention is focused, linear, and appropriate.  NEURO: Oriented as arrived to appointment on time with no prompting.   Lab Results  Component Value Date   CREATININE 1.27 04/20/2020   BUN 10 04/20/2020    NA 138 04/20/2020   K 3.9 04/20/2020   CL 99 04/20/2020   CO2 24 04/20/2020   Lab Results  Component Value Date   ALT 33 04/20/2020   AST 29 04/20/2020   ALKPHOS 70 04/20/2020   BILITOT 0.3 04/20/2020   Lab Results  Component Value Date   HGBA1C 5.8 (H) 04/20/2020   Lab Results  Component Value Date   INSULIN 10.3 04/20/2020   Lab Results  Component Value Date   TSH 1.550 04/20/2020   Lab Results  Component Value Date   CHOL 184 04/20/2020   HDL 50 04/20/2020   LDLCALC 118 (H) 04/20/2020   TRIG 86 04/20/2020   CHOLHDL 3.1 03/11/2019   Lab Results  Component Value Date   WBC 3.7 04/20/2020   HGB 15.2 04/20/2020   HCT 45.3 04/20/2020   MCV 80 04/20/2020   PLT 236 04/20/2020   Attestation Statements:   Reviewed by clinician on day of visit: allergies, medications, problem list, medical history, surgical history, family history, social history, and previous encounter notes.  I, Insurance claims handler, CMA, am acting as transcriptionist for Reuben Likes, MD.  I have reviewed the above documentation for accuracy and completeness, and  I agree with the above. - Katherina Mires, MD

## 2020-05-18 ENCOUNTER — Other Ambulatory Visit: Payer: Self-pay

## 2020-05-18 ENCOUNTER — Ambulatory Visit (INDEPENDENT_AMBULATORY_CARE_PROVIDER_SITE_OTHER): Payer: BC Managed Care – PPO | Admitting: Family Medicine

## 2020-05-18 ENCOUNTER — Encounter (INDEPENDENT_AMBULATORY_CARE_PROVIDER_SITE_OTHER): Payer: Self-pay | Admitting: Family Medicine

## 2020-05-18 VITALS — BP 147/90 | HR 101 | Temp 98.4°F | Ht 70.0 in | Wt 263.0 lb

## 2020-05-18 DIAGNOSIS — Z9189 Other specified personal risk factors, not elsewhere classified: Secondary | ICD-10-CM

## 2020-05-18 DIAGNOSIS — E559 Vitamin D deficiency, unspecified: Secondary | ICD-10-CM

## 2020-05-18 DIAGNOSIS — Z6838 Body mass index (BMI) 38.0-38.9, adult: Secondary | ICD-10-CM | POA: Diagnosis not present

## 2020-05-18 DIAGNOSIS — I10 Essential (primary) hypertension: Secondary | ICD-10-CM

## 2020-05-18 MED ORDER — VITAMIN D (ERGOCALCIFEROL) 1.25 MG (50000 UNIT) PO CAPS: 50000.0000 [IU] | ORAL_CAPSULE | ORAL | 0 refills | Status: DC

## 2020-05-19 ENCOUNTER — Ambulatory Visit: Payer: Self-pay | Admitting: Physician Assistant

## 2020-05-19 NOTE — Progress Notes (Signed)
Chief Complaint:   OBESITY Sean Kennedy is here to discuss his progress with his obesity treatment plan along with follow-up of his obesity related diagnoses. Sean Kennedy is on the Category 4 Plan and states he is following his eating plan approximately 85% of the time. Sean Kennedy states he is doing 0 minutes 0 times per week.  Today's visit was #: 3 Starting weight: 267 lbs Starting date: 04/20/2020 Today's weight: 263 lbs Today's date: 05/18/2020 Total lbs lost to date: 4 Total lbs lost since last in-office visit: 4  Interim History: Sean Kennedy had a good second 2 weeks. He felt full and felt the second 2 weeks were easier to adhere to. He used his snack calories for sweets and protein shake. He is doing well on the meal plan, and for the next few weeks he denies obstacles. He went away to [place] this past weekend.  Subjective:   1. Vitamin D deficiency Sean Kennedy denies nausea, vomiting, or muscle weakness, but he notes fatigue. He is on prescription Vit D. Last Vit D level was of 20.  2. Essential hypertension Sean Kennedy's blood pressure is elevated today. He asked his primary care provider for a refill, but he is changing primary care so he will see if a refill is given.  3. At risk for osteoporosis Sean Kennedy is at higher risk of osteopenia and osteoporosis due to Vitamin D deficiency.   Assessment/Plan:   1. Vitamin D deficiency Low Vitamin D level contributes to fatigue and are associated with obesity, breast, and colon cancer. We will refill prescription Vitamin D for 1 month. Sean Kennedy will follow-up for routine testing of Vitamin D, at least 2-3 times per year to avoid over-replacement.  - Vitamin D, Ergocalciferol, (DRISDOL) 1.25 MG (50000 UNIT) CAPS capsule; Take 1 capsule (50,000 Units total) by mouth every 7 (seven) days.  Dispense: 4 capsule; Refill: 0  2. Essential hypertension Sean Kennedy is working on healthy weight loss and exercise to improve blood pressure control. We will watch for signs of hypotension  as he continues his lifestyle modifications. We will recheck his blood pressure at his next appointment. He is to notify the office if he needs a refill.  3. At risk for osteoporosis Sean Kennedy was given approximately 15 minutes of osteoporosis prevention counseling today. Sean Kennedy is at risk for osteopenia and osteoporosis due to his Vitamin D deficiency. He was encouraged to take his Vitamin D and follow his higher calcium diet and increase strengthening exercise to help strengthen his bones and decrease his risk of osteopenia and osteoporosis.  Repetitive spaced learning was employed today to elicit superior memory formation and behavioral change.  4. Class 2 severe obesity with serious comorbidity and body mass index (BMI) of 38.0 to 38.9 in adult, unspecified obesity type (HCC) Sean Kennedy is currently in the action stage of change. As such, his goal is to continue with weight loss efforts. He has agreed to the Category 4 Plan.   Exercise goals: No exercise has been prescribed at this time.  Behavioral modification strategies: increasing lean protein intake, meal planning and cooking strategies, keeping healthy foods in the home and planning for success.  Sean Kennedy has agreed to follow-up with our clinic in 2 weeks. He was informed of the importance of frequent follow-up visits to maximize his success with intensive lifestyle modifications for his multiple health conditions.   Objective:   Blood pressure (!) 147/90, pulse (!) 101, temperature 98.4 F (36.9 C), temperature source Oral, height 5\' 10"  (1.778 m), weight 263 lb (  119.3 kg), SpO2 99 %. Body mass index is 37.74 kg/m.  General: Cooperative, alert, well developed, in no acute distress. HEENT: Conjunctivae and lids unremarkable. Cardiovascular: Regular rhythm.  Lungs: Normal work of breathing. Neurologic: No focal deficits.   Lab Results  Component Value Date   CREATININE 1.27 04/20/2020   BUN 10 04/20/2020   NA 138 04/20/2020   K 3.9  04/20/2020   CL 99 04/20/2020   CO2 24 04/20/2020   Lab Results  Component Value Date   ALT 33 04/20/2020   AST 29 04/20/2020   ALKPHOS 70 04/20/2020   BILITOT 0.3 04/20/2020   Lab Results  Component Value Date   HGBA1C 5.8 (H) 04/20/2020   Lab Results  Component Value Date   INSULIN 10.3 04/20/2020   Lab Results  Component Value Date   TSH 1.550 04/20/2020   Lab Results  Component Value Date   CHOL 184 04/20/2020   HDL 50 04/20/2020   LDLCALC 118 (H) 04/20/2020   TRIG 86 04/20/2020   CHOLHDL 3.1 03/11/2019   Lab Results  Component Value Date   WBC 3.7 04/20/2020   HGB 15.2 04/20/2020   HCT 45.3 04/20/2020   MCV 80 04/20/2020   PLT 236 04/20/2020   No results found for: IRON, TIBC, FERRITIN  Attestation Statements:   Reviewed by clinician on day of visit: allergies, medications, problem list, medical history, surgical history, family history, social history, and previous encounter notes.   I, Burt Knack, am acting as transcriptionist for Reuben Likes, MD.  I have reviewed the above documentation for accuracy and completeness, and I agree with the above. - Katherina Mires, MD

## 2020-05-29 DIAGNOSIS — Z0000 Encounter for general adult medical examination without abnormal findings: Secondary | ICD-10-CM | POA: Diagnosis not present

## 2020-06-07 ENCOUNTER — Other Ambulatory Visit: Payer: Self-pay | Admitting: Physician Assistant

## 2020-06-07 DIAGNOSIS — I10 Essential (primary) hypertension: Secondary | ICD-10-CM

## 2020-06-08 ENCOUNTER — Other Ambulatory Visit: Payer: Self-pay | Admitting: Physician Assistant

## 2020-06-08 DIAGNOSIS — I10 Essential (primary) hypertension: Secondary | ICD-10-CM

## 2020-06-11 ENCOUNTER — Ambulatory Visit (INDEPENDENT_AMBULATORY_CARE_PROVIDER_SITE_OTHER): Payer: BC Managed Care – PPO | Admitting: Family Medicine

## 2020-06-11 ENCOUNTER — Encounter (INDEPENDENT_AMBULATORY_CARE_PROVIDER_SITE_OTHER): Payer: Self-pay | Admitting: Family Medicine

## 2020-06-11 ENCOUNTER — Other Ambulatory Visit: Payer: Self-pay

## 2020-06-11 VITALS — BP 114/69 | HR 81 | Temp 98.5°F | Ht 70.0 in | Wt 257.0 lb

## 2020-06-11 DIAGNOSIS — Z9189 Other specified personal risk factors, not elsewhere classified: Secondary | ICD-10-CM | POA: Diagnosis not present

## 2020-06-11 DIAGNOSIS — E559 Vitamin D deficiency, unspecified: Secondary | ICD-10-CM

## 2020-06-11 DIAGNOSIS — I10 Essential (primary) hypertension: Secondary | ICD-10-CM | POA: Diagnosis not present

## 2020-06-11 DIAGNOSIS — Z6836 Body mass index (BMI) 36.0-36.9, adult: Secondary | ICD-10-CM

## 2020-06-11 DIAGNOSIS — M25511 Pain in right shoulder: Secondary | ICD-10-CM | POA: Diagnosis not present

## 2020-06-11 MED ORDER — IBUPROFEN 800 MG PO TABS: 800.0000 mg | ORAL_TABLET | Freq: Three times a day (TID) | ORAL | 0 refills | Status: DC

## 2020-06-11 MED ORDER — LOSARTAN POTASSIUM-HCTZ 50-12.5 MG PO TABS: 1.0000 | ORAL_TABLET | Freq: Every day | ORAL | 0 refills | Status: DC

## 2020-06-14 ENCOUNTER — Encounter (INDEPENDENT_AMBULATORY_CARE_PROVIDER_SITE_OTHER): Payer: Self-pay | Admitting: Family Medicine

## 2020-06-15 ENCOUNTER — Other Ambulatory Visit (INDEPENDENT_AMBULATORY_CARE_PROVIDER_SITE_OTHER): Payer: Self-pay | Admitting: Family Medicine

## 2020-06-15 ENCOUNTER — Other Ambulatory Visit (INDEPENDENT_AMBULATORY_CARE_PROVIDER_SITE_OTHER): Payer: Self-pay

## 2020-06-15 DIAGNOSIS — E559 Vitamin D deficiency, unspecified: Secondary | ICD-10-CM

## 2020-06-15 MED ORDER — VITAMIN D (ERGOCALCIFEROL) 1.25 MG (50000 UNIT) PO CAPS: 50000.0000 [IU] | ORAL_CAPSULE | ORAL | 0 refills | Status: DC

## 2020-06-15 NOTE — Telephone Encounter (Signed)
DR Ukleja 

## 2020-06-15 NOTE — Progress Notes (Signed)
Chief Complaint:   OBESITY Sean Kennedy is here to discuss his progress with his obesity treatment plan along with follow-up of his obesity related diagnoses. Sean Kennedy is on the Category 4 Plan and states he is following his eating plan approximately 85 % of the time. Sean Kennedy states he was more active since his last visit.  Today's visit was #: 4 Starting weight: 267 lbs Starting date: 04/20/20 Today's weight: 257 lbs Today's date: 06/11/2020 Total lbs lost to date: 10 Total lbs lost since last in-office visit: 6  Interim History: The last few weeks, Sean Kennedy has been in his normal routine. He got a new job and has been around quite a bit of candy and indulgences. Cravings are very intense when at work and surrounded by sweets. Sean Kennedy is doing fruit for snack calories. He is enjoying the meal plan-occasionally substituting salad for a sandwich.  Subjective:   1. Essential hypertension Sean Kennedy's blood pressure is controlled today. Sean Kennedy ran out of amlodipine previously and his blood pressure is within normal limits. Sean Kennedy denies chest pain, chest pressure, and headache.  BP Readings from Last 3 Encounters:  06/11/20 114/69  05/18/20 (!) 147/90  04/27/20 132/76   Lab Results  Component Value Date   CREATININE 1.27 04/20/2020   CREATININE 1.32 (H) 10/16/2019   CREATININE 1.33 (H) 03/11/2019    2. Right shoulder pain, unspecified chronicity Jame is status post surgery in 01/2020. [place] is experiencing right shoulder stiffness and soreness from time to time.  3. Vitamin D deficiency Sean Kennedy's Vitamin D level was 20.1 on 04/20/20. He is currently taking prescription vitamin D 50,000 IU each week. He notes fatigue and denies nausea, vomiting or muscle weakness.  4. At risk for heart disease Sean Kennedy is at a higher than average risk for cardiovascular disease due to hypertension and obesity.   Assessment/Plan:   1. Essential hypertension Sean Kennedy is working on healthy weight loss and exercise to  improve blood pressure control. We will watch for signs of hypotension as he continues his lifestyle modifications. Sean Kennedy has agreed to stop amlodipine and he will continue Hyzaar at the current dose. His goal is to wean off medication.  - losartan-hydrochlorothiazide (HYZAAR) 50-12.5 MG tablet; Take 1 tablet by mouth daily.  Dispense: 90 tablet; Refill: 0  2. Right shoulder pain, unspecified chronicity Sean Kennedy agrees to continue taking ibuprofen as needed for pain.  - ibuprofen (ADVIL) 800 MG tablet; Take 1 tablet (800 mg total) by mouth 3 (three) times daily.  Dispense: 30 tablet; Refill: 0  3. Vitamin D deficiency Low Vitamin D level contributes to fatigue and are associated with obesity, breast, and colon cancer. He agrees to continue to take prescription Vitamin D @50 ,000 IU every week and will follow-up for routine testing of Vitamin D, at least 2-3 times per year to avoid over-replacement.  4. At risk for heart disease Low Vitamin D level contributes to fatigue and are associated with obesity, breast, and colon cancer. He agrees to continue to take prescription Vitamin D @50 ,000 IU every week # 4 with no refills and will follow-up for routine testing of Vitamin D, at least 2-3 times per year to avoid over-replacement.  4. Class 2 severe obesity with serious comorbidity and body mass index (BMI) of 36.0 to 36.9 in adult, unspecified obesity type [facility])  Sean Kennedy is currently in the action stage of change. As such, his goal is to continue with weight loss efforts. He has agreed to the Category 4 Plan.   Exercise  goals: All adults should avoid inactivity. Some physical activity is better than none, and adults who participate in any amount of physical activity gain some health benefits.  Behavioral modification strategies: increasing lean protein intake, meal planning and cooking strategies, keeping healthy foods in the home and planning for success.  Sean Kennedy has agreed to follow-up with our clinic  in 3 weeks. He was informed of the importance of frequent follow-up visits to maximize his success with intensive lifestyle modifications for his multiple health conditions.    Objective:   Blood pressure 114/69, pulse 81, temperature 98.5 F (36.9 C), temperature source Oral, height 5\' 10"  (1.778 m), weight 257 lb (116.6 kg), SpO2 98 %. Body mass index is 36.88 kg/m.  General: Cooperative, alert, well developed, in no acute distress. HEENT: Conjunctivae and lids unremarkable. Cardiovascular: Regular rhythm.  Lungs: Normal work of breathing. Neurologic: No focal deficits.   Lab Results  Component Value Date   CREATININE 1.27 04/20/2020   BUN 10 04/20/2020   NA 138 04/20/2020   K 3.9 04/20/2020   CL 99 04/20/2020   CO2 24 04/20/2020   Lab Results  Component Value Date   ALT 33 04/20/2020   AST 29 04/20/2020   ALKPHOS 70 04/20/2020   BILITOT 0.3 04/20/2020   Lab Results  Component Value Date   HGBA1C 5.8 (H) 04/20/2020   Lab Results  Component Value Date   INSULIN 10.3 04/20/2020   Lab Results  Component Value Date   TSH 1.550 04/20/2020   Lab Results  Component Value Date   CHOL 184 04/20/2020   HDL 50 04/20/2020   LDLCALC 118 (H) 04/20/2020   TRIG 86 04/20/2020   CHOLHDL 3.1 03/11/2019   Lab Results  Component Value Date   WBC 3.7 04/20/2020   HGB 15.2 04/20/2020   HCT 45.3 04/20/2020   MCV 80 04/20/2020   PLT 236 04/20/2020   No results found for: IRON, TIBC, FERRITIN   Reviewed by clinician on day of visit: allergies, medications, problem list, medical history, surgical history, family history, social history, and previous encounter notes.  I03/06/2020, CMA, am acting as transcriptionist for Kirke Corin, MD   I have reviewed the above documentation for accuracy and completeness, and I agree with the above. - Reuben Likes, MD

## 2020-07-08 ENCOUNTER — Other Ambulatory Visit (INDEPENDENT_AMBULATORY_CARE_PROVIDER_SITE_OTHER): Payer: Self-pay | Admitting: Family Medicine

## 2020-07-08 ENCOUNTER — Ambulatory Visit (INDEPENDENT_AMBULATORY_CARE_PROVIDER_SITE_OTHER): Payer: BC Managed Care – PPO | Admitting: Family Medicine

## 2020-07-08 ENCOUNTER — Other Ambulatory Visit: Payer: Self-pay

## 2020-07-08 ENCOUNTER — Encounter (INDEPENDENT_AMBULATORY_CARE_PROVIDER_SITE_OTHER): Payer: Self-pay | Admitting: Family Medicine

## 2020-07-08 VITALS — BP 142/92 | HR 77 | Temp 98.7°F | Ht 70.0 in | Wt 263.0 lb

## 2020-07-08 DIAGNOSIS — E559 Vitamin D deficiency, unspecified: Secondary | ICD-10-CM | POA: Diagnosis not present

## 2020-07-08 DIAGNOSIS — Z6837 Body mass index (BMI) 37.0-37.9, adult: Secondary | ICD-10-CM | POA: Diagnosis not present

## 2020-07-08 DIAGNOSIS — F3289 Other specified depressive episodes: Secondary | ICD-10-CM

## 2020-07-08 DIAGNOSIS — Z9189 Other specified personal risk factors, not elsewhere classified: Secondary | ICD-10-CM | POA: Diagnosis not present

## 2020-07-08 MED ORDER — VITAMIN D (ERGOCALCIFEROL) 1.25 MG (50000 UNIT) PO CAPS: 50000.0000 [IU] | ORAL_CAPSULE | ORAL | 0 refills | Status: DC

## 2020-07-08 MED ORDER — BUPROPION HCL ER (XL) 150 MG PO TB24: 150.0000 mg | ORAL_TABLET | Freq: Every day | ORAL | 0 refills | Status: DC

## 2020-07-08 NOTE — Telephone Encounter (Signed)
Dr.Ukleja 

## 2020-07-09 NOTE — Progress Notes (Signed)
Chief Complaint:   OBESITY Sean Kennedy is here to discuss his progress with his obesity treatment plan along with follow-up of his obesity related diagnoses. Sean Kennedy is on the Category 4 Plan and states he is following his eating plan approximately 40% of the time. Sean Kennedy states he is walking 1 mile 7 times per week.  Today's visit was #: 5 Starting weight: 267 lbs Starting date: 04/20/2020 Today's weight: 263 lbs Today's date: 07/08/2020 Total lbs lost to date: 4 lbs Total lbs lost since last in-office visit: 0  Interim History: Sean Kennedy went to [facility] for a conference where all food was provided. He got back over a week ago and has struggled to pull himself back on plan. He has been going out to eat more and not packing his lunch and snacking more.  Subjective:   1. Vitamin D deficiency Sean Kennedy's last Vit D level was 20.1. He denies nausea, vomiting, and muscle weakness but notes fatigue. Pt is on prescription Vit D.  2. Other depression, with emotional eating Sean Kennedy's BP is slightly elevated today but previously well controlled. He has no history of seizures. He reports cravings with decreased control.   3. At risk for side effect of medication Sean Kennedy is at risk for side effect of medication due to starting Wellbutrin.  Assessment/Plan:   1. Vitamin D deficiency Low Vitamin D level contributes to fatigue and are associated with obesity, breast, and colon cancer. He agrees to continue to take prescription Vitamin D @50 ,000 IU every week and will follow-up for routine testing of Vitamin D, at least 2-3 times per year to avoid over-replacement.  - Vitamin D, Ergocalciferol, (DRISDOL) 1.25 MG (50000 UNIT) CAPS capsule; Take 1 capsule (50,000 Units total) by mouth every 7 (seven) days.  Dispense: 4 capsule; Refill: 0  2. Other depression, with emotional eating Behavior modification techniques were discussed today to help Sean Kennedy deal with his emotional/non-hunger eating behaviors.  Orders and  follow up as documented in patient record. Start Wellbutrin 150 mg, as per below.  - buPROPion (WELLBUTRIN XL) 150 MG 24 hr tablet; Take 1 tablet (150 mg total) by mouth daily.  Dispense: 30 tablet; Refill: 0  3. At risk for side effect of medication Sean Kennedy was given approximately 15 minutes of drug side effect counseling today.  We discussed side effect possibility and risk versus benefits. Sean Kennedy agreed to the medication and will contact this office if these side effects are intolerable.  Repetitive spaced learning was employed today to elicit superior memory formation and behavioral change.  4. Class 2 severe obesity with serious comorbidity and body mass index (BMI) of 37.0 to 37.9 in adult, unspecified obesity type (HCC) Sean Kennedy is currently in the action stage of change. As such, his goal is to continue with weight loss efforts. He has agreed to the Category 4 Plan and keeping a food journal and adhering to recommended goals of 450-600 calories and 40+ g protein.   Exercise goals: No exercise has been prescribed at this time.  Behavioral modification strategies: increasing lean protein intake, meal planning and cooking strategies, keeping healthy foods in the home, planning for success and keeping a strict food journal.  Sean Kennedy has agreed to follow-up with our clinic in 2-3 weeks. He was informed of the importance of frequent follow-up visits to maximize his success with intensive lifestyle modifications for his multiple health conditions.   Objective:   Blood pressure (!) 142/92, pulse 77, temperature 98.7 F (37.1 C), temperature source Oral, height  5\' 10"  (1.778 m), weight 263 lb (119.3 kg), SpO2 98 %. Body mass index is 37.74 kg/m.  General: Cooperative, alert, well developed, in no acute distress. HEENT: Conjunctivae and lids unremarkable. Cardiovascular: Regular rhythm.  Lungs: Normal work of breathing. Neurologic: No focal deficits.   Lab Results  Component Value Date    CREATININE 1.27 04/20/2020   BUN 10 04/20/2020   NA 138 04/20/2020   K 3.9 04/20/2020   CL 99 04/20/2020   CO2 24 04/20/2020   Lab Results  Component Value Date   ALT 33 04/20/2020   AST 29 04/20/2020   ALKPHOS 70 04/20/2020   BILITOT 0.3 04/20/2020   Lab Results  Component Value Date   HGBA1C 5.8 (H) 04/20/2020   Lab Results  Component Value Date   INSULIN 10.3 04/20/2020   Lab Results  Component Value Date   TSH 1.550 04/20/2020   Lab Results  Component Value Date   CHOL 184 04/20/2020   HDL 50 04/20/2020   LDLCALC 118 (H) 04/20/2020   TRIG 86 04/20/2020   CHOLHDL 3.1 03/11/2019   Lab Results  Component Value Date   WBC 3.7 04/20/2020   HGB 15.2 04/20/2020   HCT 45.3 04/20/2020   MCV 80 04/20/2020   PLT 236 04/20/2020    Attestation Statements:   Reviewed by clinician on day of visit: allergies, medications, problem list, medical history, surgical history, family history, social history, and previous encounter notes.  06/18/2020, am acting as transcriptionist for Edmund Hilda, MD.   I have reviewed the above documentation for accuracy and completeness, and I agree with the above. - Reuben Likes, MD

## 2020-07-15 ENCOUNTER — Encounter (INDEPENDENT_AMBULATORY_CARE_PROVIDER_SITE_OTHER): Payer: Self-pay | Admitting: Family Medicine

## 2020-07-29 ENCOUNTER — Ambulatory Visit (INDEPENDENT_AMBULATORY_CARE_PROVIDER_SITE_OTHER): Payer: BC Managed Care – PPO | Admitting: Family Medicine

## 2020-08-06 ENCOUNTER — Ambulatory Visit (INDEPENDENT_AMBULATORY_CARE_PROVIDER_SITE_OTHER): Payer: BC Managed Care – PPO | Admitting: Family Medicine

## 2020-08-09 IMAGING — CR DG SHOULDER 2+V*R*
1 series · 3 of 3 positions shown · non-contrast
Comparison: None.

CLINICAL DATA: Right shoulder pain

EXAM:
RIGHT SHOULDER - 2+ VIEW

[Series 1: dg shoulder right · 0.14mm/px · 3 of 3 slices shown]
[im 1/3]
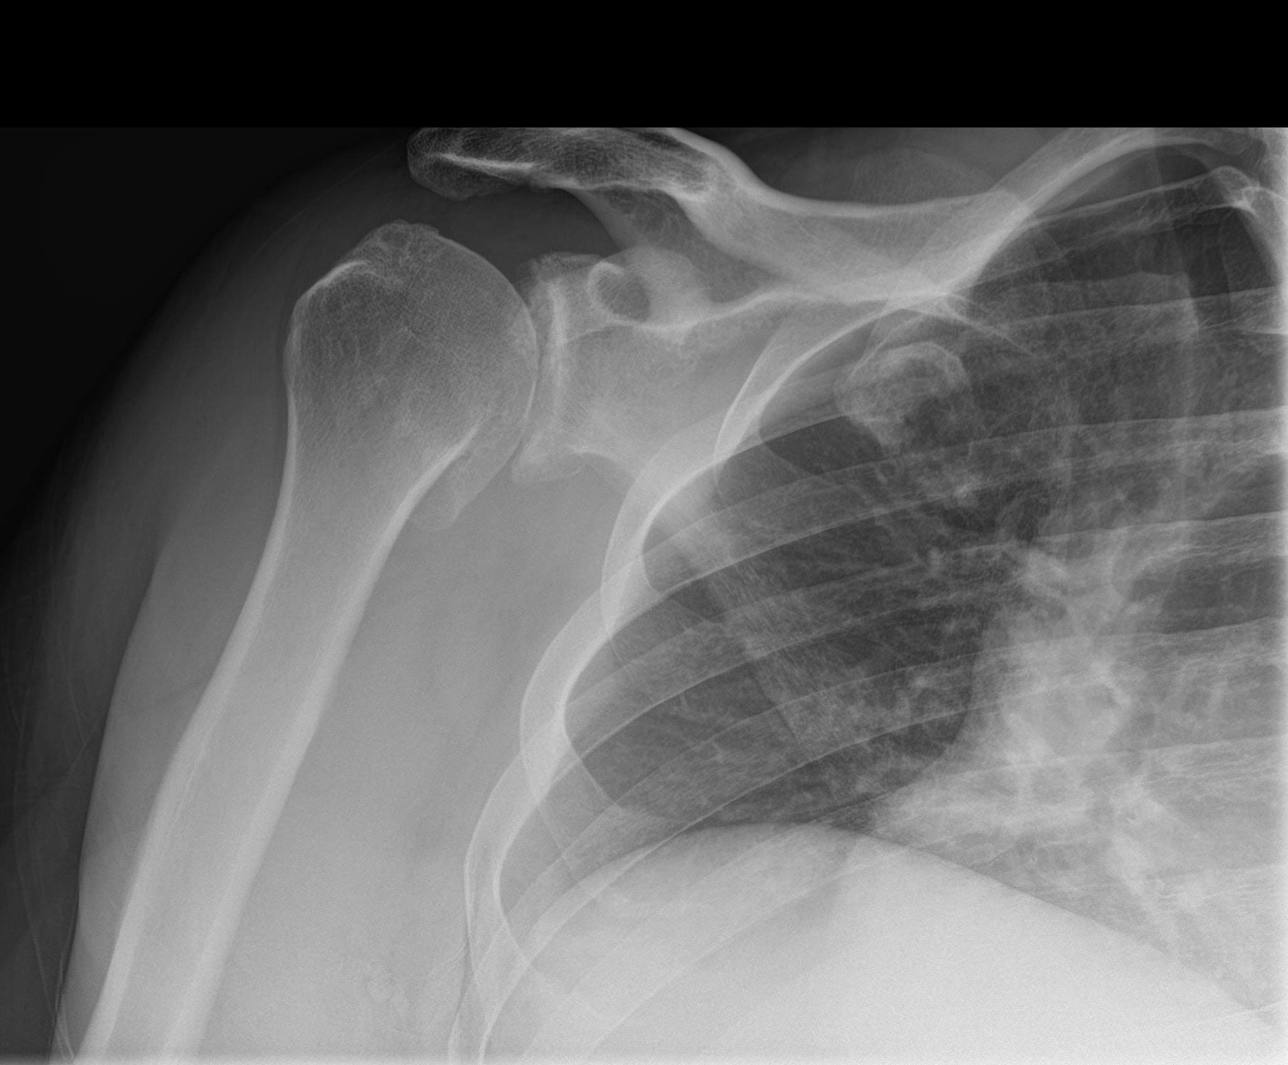
[im 2/3]
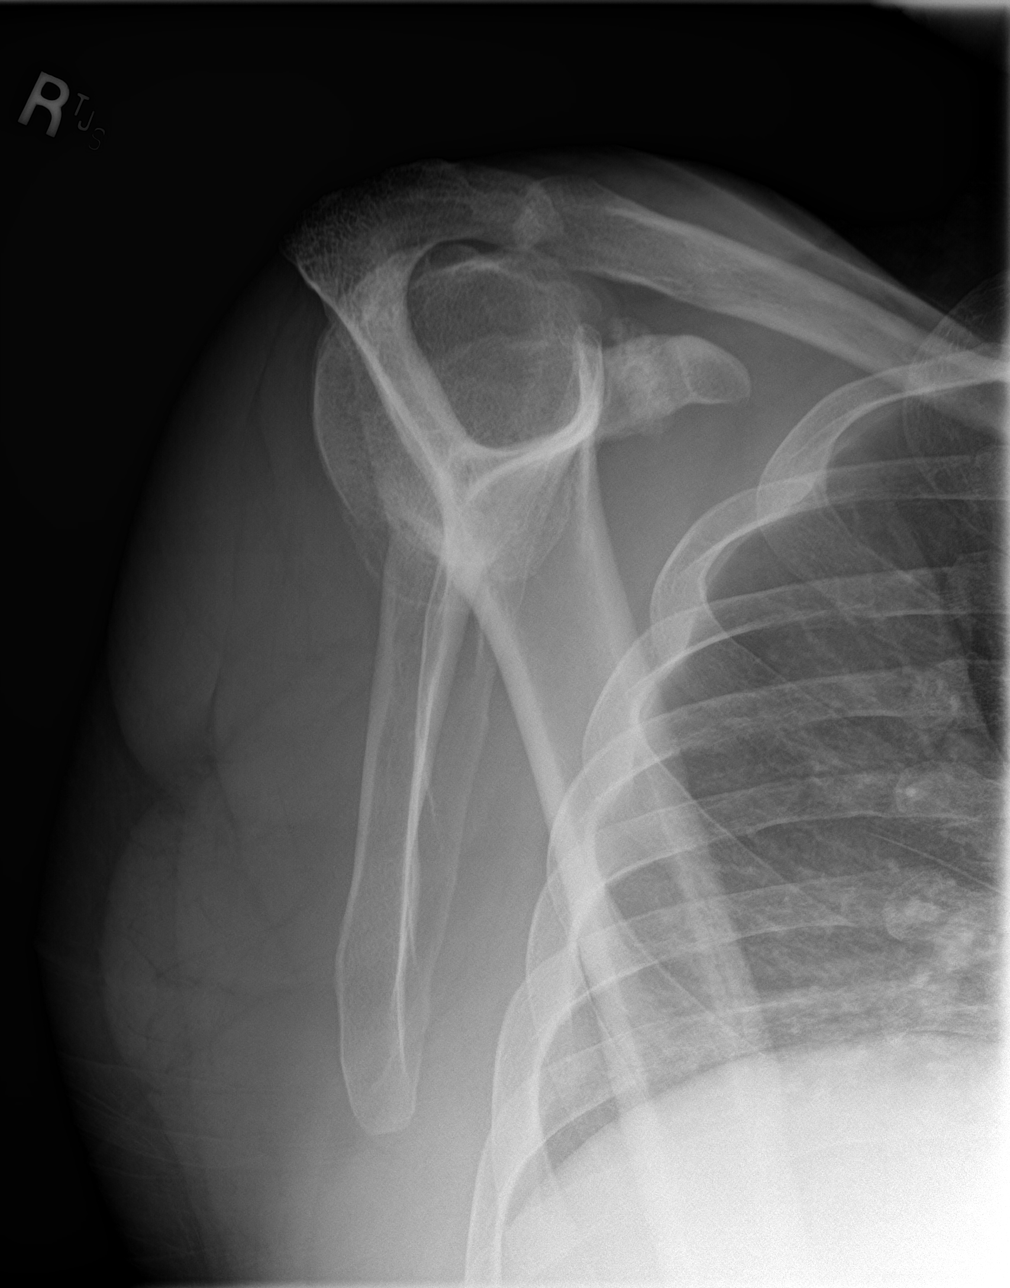
[im 3/3]
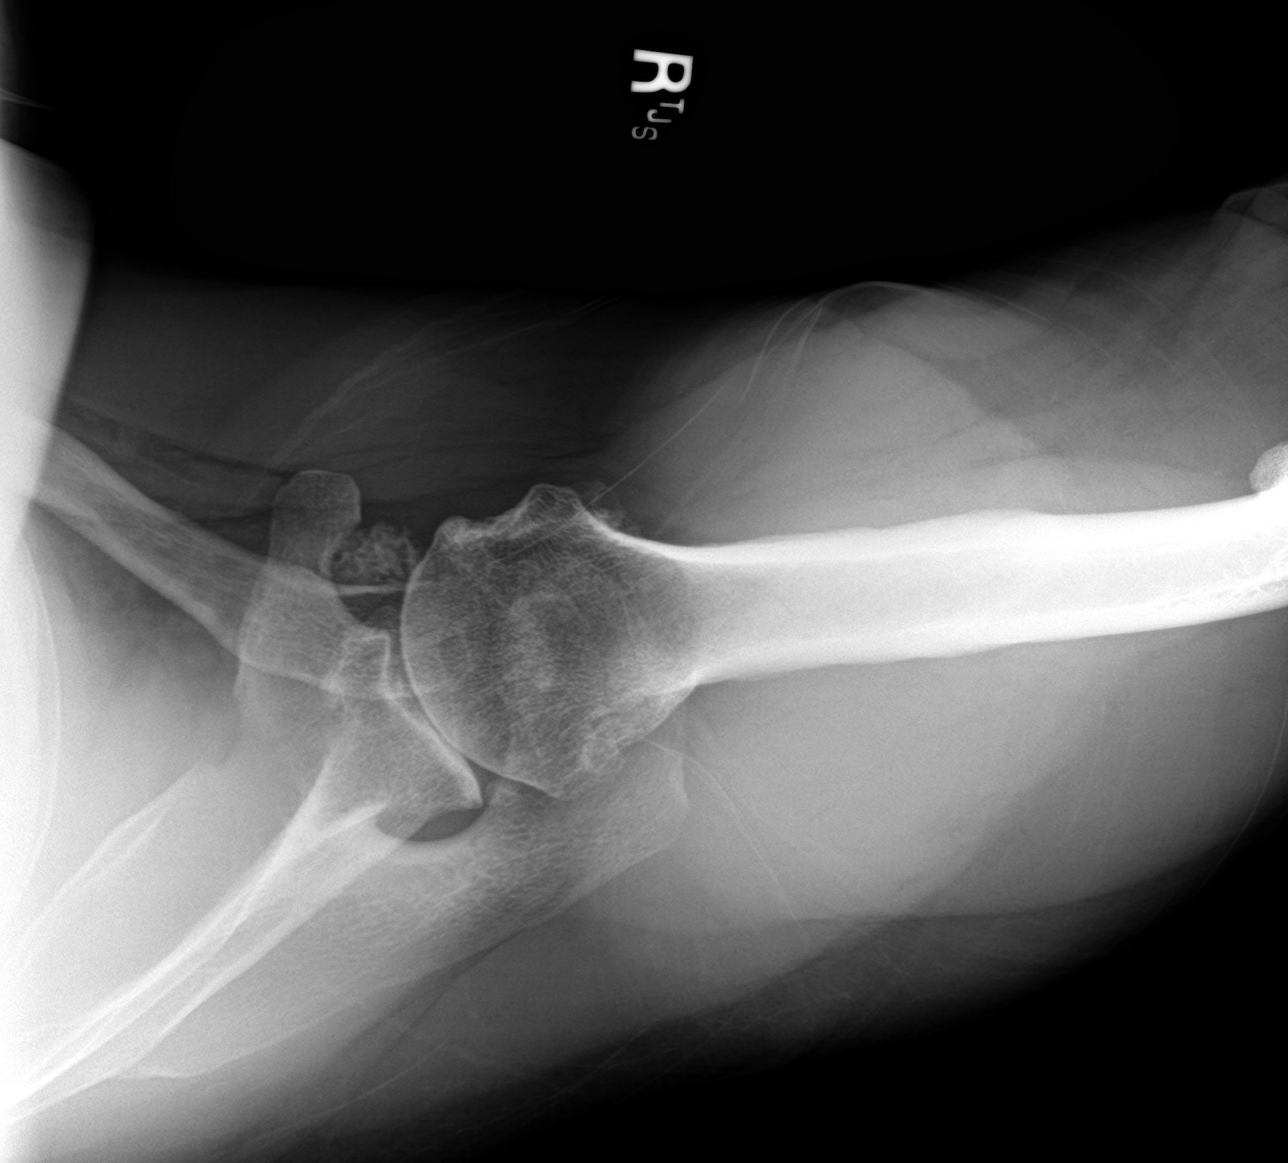

[3 of 3 positions shown; findings below may reference images not displayed]

FINDINGS: No fracture or malalignment. Calcified loose body along the anterior
joint. Moderate arthritis of the glenohumeral joint with prominent
inferior glenohumeral spurring. Irregularity at the inferior glenoid
could be due to prominent spurring versus old Oehri injury.
IMPRESSION: No acute osseous abnormality. Moderate arthritis of the glenohumeral
joint. Calcified loose body along the anterior joint.

## 2020-08-11 ENCOUNTER — Encounter (INDEPENDENT_AMBULATORY_CARE_PROVIDER_SITE_OTHER): Payer: Self-pay | Admitting: Family Medicine

## 2020-08-11 ENCOUNTER — Ambulatory Visit (INDEPENDENT_AMBULATORY_CARE_PROVIDER_SITE_OTHER): Payer: BC Managed Care – PPO | Admitting: Family Medicine

## 2020-08-11 ENCOUNTER — Other Ambulatory Visit: Payer: Self-pay

## 2020-08-11 VITALS — BP 122/77 | HR 80 | Temp 98.5°F | Ht 70.0 in | Wt 263.0 lb

## 2020-08-11 DIAGNOSIS — Z9189 Other specified personal risk factors, not elsewhere classified: Secondary | ICD-10-CM | POA: Diagnosis not present

## 2020-08-11 DIAGNOSIS — E559 Vitamin D deficiency, unspecified: Secondary | ICD-10-CM | POA: Diagnosis not present

## 2020-08-11 DIAGNOSIS — F3289 Other specified depressive episodes: Secondary | ICD-10-CM | POA: Diagnosis not present

## 2020-08-11 DIAGNOSIS — E66812 Obesity, class 2: Secondary | ICD-10-CM

## 2020-08-11 DIAGNOSIS — Z6839 Body mass index (BMI) 39.0-39.9, adult: Secondary | ICD-10-CM

## 2020-08-12 MED ORDER — BUPROPION HCL ER (SR) 200 MG PO TB12: 200.0000 mg | ORAL_TABLET | Freq: Every morning | ORAL | 0 refills | Status: DC

## 2020-08-12 MED ORDER — VITAMIN D (ERGOCALCIFEROL) 1.25 MG (50000 UNIT) PO CAPS: 50000.0000 [IU] | ORAL_CAPSULE | ORAL | 0 refills | Status: DC

## 2020-08-13 NOTE — Progress Notes (Signed)
Chief Complaint:   OBESITY Sean Kennedy is here to discuss his progress with his obesity treatment plan along with follow-up of his obesity related diagnoses. Sean Kennedy is on the Category 4 Plan and keeping a food journal and adhering to recommended goals of 450-600 calories and 40+ grams of protein at lunch daily and states he is following his eating plan approximately 0% of the time. Sean Kennedy states he is at the gym for 60 minutes 2 times per week.  Today's visit was #: 6 Starting weight: 267 lbs Starting date: 04/20/2020 Today's weight: 263 lbs Today's date: 08/11/2020 Total lbs lost to date: 4 Total lbs lost since last in-office visit: 0  Interim History: Amadou has done well maintaining his weight. He is deviating more from lunch and his protein has likely decreased which is decreased his RMR. He is open to looking at other ideas for lunch.  Subjective:   1. Vitamin D deficiency Sean Kennedy is stable on Vit D, and he requests a refill today.  2. Other depression, with emotional eating Sean Kennedy feels his Wellbutrin isn't helping him with emotional eating behavior. He works in a place with free high sugar foods and avoiding this temptation is difficult.  3. At risk for impaired metabolic function Sean Kennedy is at increased risk for impaired metabolic function due to decrease in protein.  Assessment/Plan:   1. Vitamin D deficiency Low Vitamin D level contributes to fatigue and are associated with obesity, breast, and colon cancer. We will refill prescription Vitamin D for 1 month. Sean Kennedy will follow-up for routine testing of Vitamin D, at least 2-3 times per year to avoid over-replacement.  - Vitamin D, Ergocalciferol, (DRISDOL) 1.25 MG (50000 UNIT) CAPS capsule; Take 1 capsule (50,000 Units total) by mouth every 7 (seven) days.  Dispense: 4 capsule; Refill: 0  2. Other depression, with emotional eating Behavior modification techniques were discussed today to help Sean Kennedy deal with his emotional/non-hunger  eating behaviors. Sean Kennedy agreed to change to Wellbutrin SR 200 mg q AM with no refills. Orders and follow up as documented in patient record.   - buPROPion (WELLBUTRIN SR) 200 MG 12 hr tablet; Take 1 tablet (200 mg total) by mouth in the morning.  Dispense: 30 tablet; Refill: 0  3. At risk for impaired metabolic function Sean Kennedy was given approximately 15 minutes of impaired  metabolic function prevention counseling today. We discussed intensive lifestyle modifications today with an emphasis on specific nutrition and exercise instructions and strategies.   Repetitive spaced learning was employed today to elicit superior memory formation and behavioral change.  4. Obesity with current BMI of 37.8 Sean Kennedy is currently in the action stage of change. As such, his goal is to continue with weight loss efforts. He has agreed to the Category 4 Plan and keeping a food journal and adhering to recommended goals of 400-550 calories and 35+ grams of protein at lunch daily.   Exercise goals: As is.  Behavioral modification strategies: increasing lean protein intake and meal planning and cooking strategies.  Sean Kennedy has agreed to follow-up with our clinic in 2 to 3 weeks. He was informed of the importance of frequent follow-up visits to maximize his success with intensive lifestyle modifications for his multiple health conditions.   Objective:   Blood pressure 122/77, pulse 80, temperature 98.5 F (36.9 C), height 5\' 10"  (1.778 m), weight 263 lb (119.3 kg), SpO2 97 %. Body mass index is 37.74 kg/m.  General: Cooperative, alert, well developed, in no acute distress. HEENT: Conjunctivae  and lids unremarkable. Cardiovascular: Regular rhythm.  Lungs: Normal work of breathing. Neurologic: No focal deficits.   Lab Results  Component Value Date   CREATININE 1.27 04/20/2020   BUN 10 04/20/2020   NA 138 04/20/2020   K 3.9 04/20/2020   CL 99 04/20/2020   CO2 24 04/20/2020   Lab Results  Component Value Date    ALT 33 04/20/2020   AST 29 04/20/2020   ALKPHOS 70 04/20/2020   BILITOT 0.3 04/20/2020   Lab Results  Component Value Date   HGBA1C 5.8 (H) 04/20/2020   Lab Results  Component Value Date   INSULIN 10.3 04/20/2020   Lab Results  Component Value Date   TSH 1.550 04/20/2020   Lab Results  Component Value Date   CHOL 184 04/20/2020   HDL 50 04/20/2020   LDLCALC 118 (H) 04/20/2020   TRIG 86 04/20/2020   CHOLHDL 3.1 03/11/2019   Lab Results  Component Value Date   WBC 3.7 04/20/2020   HGB 15.2 04/20/2020   HCT 45.3 04/20/2020   MCV 80 04/20/2020   PLT 236 04/20/2020   No results found for: IRON, TIBC, FERRITIN  Attestation Statements:   Reviewed by clinician on day of visit: allergies, medications, problem list, medical history, surgical history, family history, social history, and previous encounter notes.   I, Burt Knack, am acting as transcriptionist for Quillian Quince, MD.  I have reviewed the above documentation for accuracy and completeness, and I agree with the above. -  Quillian Quince, MD

## 2020-08-31 ENCOUNTER — Encounter (INDEPENDENT_AMBULATORY_CARE_PROVIDER_SITE_OTHER): Payer: Self-pay | Admitting: Family Medicine

## 2020-08-31 ENCOUNTER — Other Ambulatory Visit: Payer: Self-pay

## 2020-08-31 ENCOUNTER — Ambulatory Visit (INDEPENDENT_AMBULATORY_CARE_PROVIDER_SITE_OTHER): Payer: BC Managed Care – PPO | Admitting: Family Medicine

## 2020-08-31 VITALS — BP 138/90 | HR 78 | Temp 98.2°F | Ht 70.0 in | Wt 260.0 lb

## 2020-08-31 DIAGNOSIS — E559 Vitamin D deficiency, unspecified: Secondary | ICD-10-CM | POA: Diagnosis not present

## 2020-08-31 DIAGNOSIS — Z9189 Other specified personal risk factors, not elsewhere classified: Secondary | ICD-10-CM

## 2020-08-31 DIAGNOSIS — Z6839 Body mass index (BMI) 39.0-39.9, adult: Secondary | ICD-10-CM

## 2020-08-31 DIAGNOSIS — I10 Essential (primary) hypertension: Secondary | ICD-10-CM

## 2020-08-31 DIAGNOSIS — F3289 Other specified depressive episodes: Secondary | ICD-10-CM

## 2020-08-31 MED ORDER — BUPROPION HCL ER (SR) 200 MG PO TB12: 200.0000 mg | ORAL_TABLET | Freq: Every morning | ORAL | 0 refills | Status: DC

## 2020-08-31 MED ORDER — VITAMIN D (ERGOCALCIFEROL) 1.25 MG (50000 UNIT) PO CAPS: 50000.0000 [IU] | ORAL_CAPSULE | ORAL | 0 refills | Status: DC

## 2020-08-31 MED ORDER — TOPIRAMATE 50 MG PO TABS: 50.0000 mg | ORAL_TABLET | Freq: Every day | ORAL | 0 refills | Status: DC

## 2020-08-31 NOTE — Progress Notes (Signed)
Chief Complaint:   OBESITY Sean Kennedy is here to discuss his progress with his obesity treatment plan along with follow-up of his obesity related diagnoses. Truth is on the Category 4 Plan and keeping a food journal and adhering to recommended goals of 400-500 calories and 35+ grams of protein at lunch daily and states he is following his eating plan approximately 60% of the time. Sean Kennedy states he is doing 0 minutes 0 times per week.  Today's visit was #: 7 Starting weight: 267 lbs Starting date: 04/20/2020 Today's weight: 260 lbs Today's date: 08/31/2020 Total lbs lost to date: 7 Total lbs lost since last in-office visit: 3  Interim History: Sean Kennedy has done better with weight loss. He notes decreased energy in the daytime, but he is getting interrupted sleep partly due to his 1 year daughter. He wonders if taking Vyvanse like his wife is doing would help.  Subjective:   1. Vitamin D deficiency Sean Kennedy is stable on Vit D, and he denies nausea or vomiting.  2. Essential hypertension Sean Kennedy's blood pressure is mildly elevated today. He is on losartam and he is working on weight loss. No mentions of chest pain or headache.  3. Other depression, with emotional eating Sean Kennedy is stable on Wellbutrin, and he is still struggling with cravings and some daytime fatigue (he has a 1 year old daughter). He denies a history of nephrolithiasis.  4. At risk for heart disease Sean Kennedy is at a higher than average risk for cardiovascular disease due to obesity.   Assessment/Plan:   1. Vitamin D deficiency Low Vitamin D level contributes to fatigue and are associated with obesity, breast, and colon cancer. We will refill prescription Vitamin D for 1 month. Jakorian will follow-up for routine testing of Vitamin D, at least 2-3 times per year to avoid over-replacement.  - Vitamin D, Ergocalciferol, (DRISDOL) 1.25 MG (50000 UNIT) CAPS capsule; Take 1 capsule (50,000 Units total) by mouth every 7 (seven) days.   Dispense: 4 capsule; Refill: 0  2. Essential hypertension Sean Kennedy will continue with diet, exercise, and medications to improve blood pressure control. Will continue to monitor as he continues his lifestyle modifications.  3. Other depression, with emotional eating Behavior modification techniques were discussed today to help Sean Kennedy deal with his emotional/non-hunger eating behaviors. We will refill Wellbutrin SR for 1 month, and Sean Kennedy agreed to start Topamax 50 mg qhs with no refills. Orders and follow up as documented in patient record.   - buPROPion (WELLBUTRIN SR) 200 MG 12 hr tablet; Take 1 tablet (200 mg total) by mouth in the morning.  Dispense: 30 tablet; Refill: 0 - topiramate (TOPAMAX) 50 MG tablet; Take 1 tablet (50 mg total) by mouth at bedtime.  Dispense: 30 tablet; Refill: 0  4. At risk for heart disease Sean Kennedy was given approximately 15 minutes of coronary artery disease prevention counseling today. He is 40 y.o. male and has risk factors for heart disease including obesity. We discussed intensive lifestyle modifications today with an emphasis on specific weight loss instructions and strategies.   Repetitive spaced learning was employed today to elicit superior memory formation and behavioral change.  5. Obesity with current BMI 37.4 Sean Kennedy is currently in the action stage of change. As such, his goal is to continue with weight loss efforts. He has agreed to the Category 4 Plan.   Sean Kennedy shows no signs of binge eating disorder. He was told that taking controlled medications such as stimulants would not be a good idea  and could cause addiction.  Behavioral modification strategies: increasing lean protein intake and meal planning and cooking strategies.  Sean Kennedy has agreed to follow-up with our clinic in 2 to 3 weeks. He was informed of the importance of frequent follow-up visits to maximize his success with intensive lifestyle modifications for his multiple health conditions.    Objective:   Blood pressure 138/90, pulse 78, temperature 98.2 F (36.8 C), height 5\' 10"  (1.778 m), weight 260 lb (117.9 kg), SpO2 98 %. Body mass index is 37.31 kg/m.  General: Cooperative, alert, well developed, in no acute distress. HEENT: Conjunctivae and lids unremarkable. Cardiovascular: Regular rhythm.  Lungs: Normal work of breathing. Neurologic: No focal deficits.   Lab Results  Component Value Date   CREATININE 1.27 04/20/2020   BUN 10 04/20/2020   NA 138 04/20/2020   K 3.9 04/20/2020   CL 99 04/20/2020   CO2 24 04/20/2020   Lab Results  Component Value Date   ALT 33 04/20/2020   AST 29 04/20/2020   ALKPHOS 70 04/20/2020   BILITOT 0.3 04/20/2020   Lab Results  Component Value Date   HGBA1C 5.8 (H) 04/20/2020   Lab Results  Component Value Date   INSULIN 10.3 04/20/2020   Lab Results  Component Value Date   TSH 1.550 04/20/2020   Lab Results  Component Value Date   CHOL 184 04/20/2020   HDL 50 04/20/2020   LDLCALC 118 (H) 04/20/2020   TRIG 86 04/20/2020   CHOLHDL 3.1 03/11/2019   Lab Results  Component Value Date   WBC 3.7 04/20/2020   HGB 15.2 04/20/2020   HCT 45.3 04/20/2020   MCV 80 04/20/2020   PLT 236 04/20/2020   No results found for: IRON, TIBC, FERRITIN  Attestation Statements:   Reviewed by clinician on day of visit: allergies, medications, problem list, medical history, surgical history, family history, social history, and previous encounter notes.   I, 06/18/2020, am acting as transcriptionist for Burt Knack, MD.  I have reviewed the above documentation for accuracy and completeness, and I agree with the above. -  Quillian Quince, MD

## 2020-09-15 ENCOUNTER — Encounter (INDEPENDENT_AMBULATORY_CARE_PROVIDER_SITE_OTHER): Payer: Self-pay | Admitting: Family Medicine

## 2020-09-15 ENCOUNTER — Other Ambulatory Visit: Payer: Self-pay

## 2020-09-15 ENCOUNTER — Ambulatory Visit (INDEPENDENT_AMBULATORY_CARE_PROVIDER_SITE_OTHER): Payer: BC Managed Care – PPO | Admitting: Family Medicine

## 2020-09-15 VITALS — BP 150/97 | HR 70 | Temp 98.1°F | Ht 70.0 in | Wt 255.0 lb

## 2020-09-15 DIAGNOSIS — R7303 Prediabetes: Secondary | ICD-10-CM | POA: Diagnosis not present

## 2020-09-15 DIAGNOSIS — I10 Essential (primary) hypertension: Secondary | ICD-10-CM

## 2020-09-15 DIAGNOSIS — Z9189 Other specified personal risk factors, not elsewhere classified: Secondary | ICD-10-CM

## 2020-09-15 DIAGNOSIS — F3289 Other specified depressive episodes: Secondary | ICD-10-CM

## 2020-09-15 DIAGNOSIS — Z6839 Body mass index (BMI) 39.0-39.9, adult: Secondary | ICD-10-CM

## 2020-09-15 MED ORDER — OZEMPIC (0.25 OR 0.5 MG/DOSE) 2 MG/1.5ML ~~LOC~~ SOPN
0.2500 mg | PEN_INJECTOR | SUBCUTANEOUS | 0 refills | Status: DC
Start: 2020-09-15 — End: 2020-10-07

## 2020-09-16 NOTE — Progress Notes (Signed)
Chief Complaint:   OBESITY Sean Kennedy is here to discuss his progress with his obesity treatment plan along with follow-up of his obesity related diagnoses. Sean Kennedy is on the Category 4 Plan and states he is following his eating plan approximately 80% of the time. Sean Kennedy states he is doing 0 minutes 0 times per week.  Today's visit was #: 8 Starting weight: 267 lbs Starting date: 04/20/2020 Today's weight: 255 lbs Today's date: 09/15/2020 Total lbs lost to date: 12 Total lbs lost since last in-office visit: 5  Interim History: Sean Kennedy has access to snacks all day at work. He works at [employer] and snacks are free. He notes hunger throughout the day. He is not meal prepping and he tends to snack at work on [employer].   Subjective:   1. Essential hypertension BP elevated today. Sean Kennedy feels he is a bit stressed due to moving this past weekend. He is compliant with his medications.   BP Readings from Last 3 Encounters:  09/15/20 (!) 150/97  08/31/20 138/90  08/11/20 122/77   Lab Results  Component Value Date   CREATININE 1.27 04/20/2020   CREATININE 1.32 (H) 10/16/2019   CREATININE 1.33 (H) 03/11/2019   2. Pre-diabetes Sean Kennedy's last A1c was 5.8. He is not on metformin, and he denies a history of pancreatitis, cholelithiasis, family or personal history of thyroid cancer.   Lab Results  Component Value Date   HGBA1C 5.8 (H) 04/20/2020   Lab Results  Component Value Date   INSULIN 10.3 04/20/2020   3. Other depression, with emotional eating Sean Kennedy started Topamax but he does not feel it helps. However, he does feel the bupropion helps with his overall mood.  4. At risk for side effect of medication Sean Kennedy is at risk of drug side effects due to starting Ozempic.  Assessment/Plan:   1. Essential hypertension Sean Kennedy will continue Hyzaar.  2. Pre-diabetes Sean Kennedy agreed to start Ozempic 0.25 mg  weekly with no refills.  - Semaglutide,0.25 or 0.5MG /DOS, (OZEMPIC, 0.25  OR 0.5 MG/DOSE,) 2 MG/1.5ML SOPN; Inject 0.25 mg into the skin once a week.  Dispense: 1.5 mL; Refill: 0  3. Other depression, with emotional eating Sean Kennedy agreed to discontinue Topamax, and he will continue bupropion as is.   4. At risk for side effect of medication Sean Kennedy was given approximately 15 minutes of drug side effect counseling today.  We discussed side effect possibility and risk versus benefits. Sean Kennedy agreed to the medication and will contact this office if these side effects are intolerable.  Repetitive spaced learning was employed today to elicit superior memory formation and behavioral change.  5. Obesity with current BMI 36.59 Sean Kennedy is currently in the action stage of change. As such, his goal is to continue with weight loss efforts. He has agreed to the Category 4 Plan.   He will prep snacks for work.  Exercise goals: No exercise has been prescribed at this time.  Behavioral modification strategies: increasing lean protein intake and decreasing simple carbohydrates.  Sean Kennedy has agreed to follow-up with our clinic in 3 weeks.  Objective:   Blood pressure (!) 150/97, pulse 70, temperature 98.1 F (36.7 C), height 5\' 10"  (1.778 m), weight 255 lb (115.7 kg), SpO2 99 %. Body mass index is 36.59 kg/m.  General: Cooperative, alert, well developed, in no acute distress. HEENT: Conjunctivae and lids unremarkable. Cardiovascular: Regular rhythm.  Lungs: Normal work of breathing. Neurologic: No focal deficits.   Lab Results  Component  Value Date   CREATININE 1.27 04/20/2020   BUN 10 04/20/2020   NA 138 04/20/2020   K 3.9 04/20/2020   CL 99 04/20/2020   CO2 24 04/20/2020   Lab Results  Component Value Date   ALT 33 04/20/2020   AST 29 04/20/2020   ALKPHOS 70 04/20/2020   BILITOT 0.3 04/20/2020   Lab Results  Component Value Date   HGBA1C 5.8 (H) 04/20/2020   Lab Results  Component Value Date   INSULIN 10.3 04/20/2020   Lab Results  Component Value Date    TSH 1.550 04/20/2020   Lab Results  Component Value Date   CHOL 184 04/20/2020   HDL 50 04/20/2020   LDLCALC 118 (H) 04/20/2020   TRIG 86 04/20/2020   CHOLHDL 3.1 03/11/2019   Lab Results  Component Value Date   WBC 3.7 04/20/2020   HGB 15.2 04/20/2020   HCT 45.3 04/20/2020   MCV 80 04/20/2020   PLT 236 04/20/2020   No results found for: IRON, TIBC, FERRITIN  Attestation Statements:   Reviewed by clinician on day of visit: allergies, medications, problem list, medical history, surgical history, family history, social history, and previous encounter notes.   Trude Mcburney, am acting as Energy manager for [place], FNP-C.  I have reviewed the above documentation for accuracy and completeness, and I agree with the above. -  Jesse Sans, FNP

## 2020-09-17 ENCOUNTER — Encounter (INDEPENDENT_AMBULATORY_CARE_PROVIDER_SITE_OTHER): Payer: Self-pay | Admitting: Family Medicine

## 2020-09-17 ENCOUNTER — Telehealth (INDEPENDENT_AMBULATORY_CARE_PROVIDER_SITE_OTHER): Payer: Self-pay

## 2020-09-17 ENCOUNTER — Encounter (INDEPENDENT_AMBULATORY_CARE_PROVIDER_SITE_OTHER): Payer: Self-pay

## 2020-09-17 DIAGNOSIS — R7303 Prediabetes: Secondary | ICD-10-CM | POA: Insufficient documentation

## 2020-09-17 DIAGNOSIS — E669 Obesity, unspecified: Secondary | ICD-10-CM | POA: Insufficient documentation

## 2020-09-17 DIAGNOSIS — F32A Depression, unspecified: Secondary | ICD-10-CM | POA: Insufficient documentation

## 2020-09-17 NOTE — Telephone Encounter (Signed)
Received fax from pt's insurance stating that PA has been denied for Ozempic because the request does not meet the definition of medical necessity found in the member's benefit plan. The medication will only be covered if the pt has type 2 diabetes.  Pt notified.

## 2020-10-01 ENCOUNTER — Other Ambulatory Visit (INDEPENDENT_AMBULATORY_CARE_PROVIDER_SITE_OTHER): Payer: Self-pay | Admitting: Family Medicine

## 2020-10-01 DIAGNOSIS — I10 Essential (primary) hypertension: Secondary | ICD-10-CM

## 2020-10-01 NOTE — Telephone Encounter (Signed)
Mychart message sent.

## 2020-10-06 ENCOUNTER — Other Ambulatory Visit (INDEPENDENT_AMBULATORY_CARE_PROVIDER_SITE_OTHER): Payer: Self-pay | Admitting: Family Medicine

## 2020-10-06 DIAGNOSIS — I10 Essential (primary) hypertension: Secondary | ICD-10-CM

## 2020-10-06 MED ORDER — LOSARTAN POTASSIUM-HCTZ 50-12.5 MG PO TABS
1.0000 | ORAL_TABLET | Freq: Every day | ORAL | 0 refills | Status: DC
Start: 2020-10-06 — End: 2020-10-07

## 2020-10-07 ENCOUNTER — Other Ambulatory Visit: Payer: Self-pay

## 2020-10-07 ENCOUNTER — Ambulatory Visit (INDEPENDENT_AMBULATORY_CARE_PROVIDER_SITE_OTHER): Payer: BC Managed Care – PPO | Admitting: Family Medicine

## 2020-10-07 ENCOUNTER — Encounter (INDEPENDENT_AMBULATORY_CARE_PROVIDER_SITE_OTHER): Payer: Self-pay | Admitting: Family Medicine

## 2020-10-07 VITALS — BP 142/92 | HR 78 | Temp 98.5°F | Ht 70.0 in | Wt 262.0 lb

## 2020-10-07 DIAGNOSIS — E559 Vitamin D deficiency, unspecified: Secondary | ICD-10-CM | POA: Diagnosis not present

## 2020-10-07 DIAGNOSIS — Z9189 Other specified personal risk factors, not elsewhere classified: Secondary | ICD-10-CM

## 2020-10-07 DIAGNOSIS — Z6839 Body mass index (BMI) 39.0-39.9, adult: Secondary | ICD-10-CM

## 2020-10-07 DIAGNOSIS — I10 Essential (primary) hypertension: Secondary | ICD-10-CM

## 2020-10-07 DIAGNOSIS — F3289 Other specified depressive episodes: Secondary | ICD-10-CM | POA: Diagnosis not present

## 2020-10-07 DIAGNOSIS — R7303 Prediabetes: Secondary | ICD-10-CM

## 2020-10-07 MED ORDER — METFORMIN HCL ER 500 MG PO TB24
500.0000 mg | ORAL_TABLET | Freq: Every day | ORAL | 0 refills | Status: DC
Start: 2020-10-07 — End: 2021-01-13

## 2020-10-07 MED ORDER — LOSARTAN POTASSIUM-HCTZ 50-12.5 MG PO TABS: 1.0000 | ORAL_TABLET | Freq: Every day | ORAL | 2 refills | Status: DC

## 2020-10-07 MED ORDER — VITAMIN D (ERGOCALCIFEROL) 1.25 MG (50000 UNIT) PO CAPS: 50000.0000 [IU] | ORAL_CAPSULE | ORAL | 0 refills | Status: DC

## 2020-10-07 MED ORDER — BUPROPION HCL ER (SR) 200 MG PO TB12: 200.0000 mg | ORAL_TABLET | Freq: Every morning | ORAL | 0 refills | Status: DC

## 2020-10-14 NOTE — Progress Notes (Signed)
Chief Complaint:   OBESITY Sean Kennedy is here to discuss his progress with his obesity treatment plan along with follow-up of his obesity related diagnoses.   Today's visit was #: 9 Starting weight: 267 lbs Starting date: 04/20/2020 Today's weight: 262 lbs Today's date: 10/07/2020 Weight change since last visit: 7 lbs Total lbs lost to date: 5 lbs Body mass index is 37.59 kg/m.  Total weight loss percentage to date: -1.87%  Interim History: Sean Kennedy just got back from a cruise.  His weight is up.  He was out of his blood pressure medication, and restarted it 2 days ago.  Works in an office with free snacks - candy, chips, etc.  Favorite and worst habit - peanut butter M&Ms.  He tries to pack his lunch.  Current Meal Plan: the Category 4 Plan for 0% of the time.  Current Exercise Plan: Increased walking.  Assessment/Plan:   1. Prediabetes Not at goal. Goal is HgbA1c < 5.7.  Medication: metformin 500 mg daily.    Plan:  Start metformin 500 mg daily.  He will continue to focus on protein-rich, low simple carbohydrate foods. We reviewed the importance of hydration, regular exercise for stress reduction, and restorative sleep.   Lab Results  Component Value Date   HGBA1C 5.8 (H) 04/20/2020   Lab Results  Component Value Date   INSULIN 10.3 04/20/2020   - Start metFORMIN (GLUCOPHAGE-XR) 500 MG 24 hr tablet; Take 1 tablet (500 mg total) by mouth at bedtime.  Dispense: 30 tablet; Refill: 0  2. Vitamin D deficiency Not at goal. Current vitamin D is 20.1, tested on 04/20/2020. Optimal goal > 50 ng/dL.  He is taking vitamin D 50,000 IU weekly.  Plan: Continue to take prescription Vitamin D @50 ,000 IU every week as prescribed.  Follow-up for routine testing of Vitamin D, at least 2-3 times per year to avoid over-replacement.  - Refill Vitamin D, Ergocalciferol, (DRISDOL) 1.25 MG (50000 UNIT) CAPS capsule; Take 1 capsule (50,000 Units total) by mouth every 7 (seven) days.  Dispense: 4  capsule; Refill: 0  3. Essential hypertension Elevated today.  Medications: Hyzaar 50-12.5 mg daily.   Plan: Avoid buying foods that are: processed, frozen, or prepackaged to avoid excess salt. We will watch for signs of hypotension as he continues lifestyle modifications.  BP Readings from Last 3 Encounters:  10/07/20 (!) 142/92  09/15/20 (!) 150/97  08/31/20 138/90   Lab Results  Component Value Date   CREATININE 1.27 04/20/2020   - Refill losartan-hydrochlorothiazide (HYZAAR) 50-12.5 MG tablet; Take 1 tablet by mouth daily.  Dispense: 90 tablet; Refill: 2  4. Other depression, with emotional eating Not at goal. Medication: Wellbutrin 200 mg daily.  Plan:  Discussed social challenges and choosing foods in social situations. Discussed assertive communication skills for coping with social relationships, relapse prevention, and possible challenges ahead and how to overcome. Provided psychoeducation and facilitated discussion regarding eating as habit, self-monitoring, stimulus control, regulating eating pattern, coping skills, and barriers to success.   - Refill buPROPion (WELLBUTRIN SR) 200 MG 12 hr tablet; Take 1 tablet (200 mg total) by mouth in the morning.  Dispense: 30 tablet; Refill: 0  5. At risk for heart disease Due to Sean Kennedy's current state of health and medical condition(s), he is at a higher risk for heart disease.  This puts the patient at much greater risk to subsequently develop cardiopulmonary conditions that can significantly affect patient's quality of life in a negative manner.  At least 8 minutes were spent on counseling Demetrius about these concerns today. Evidence-based interventions for health behavior change were utilized today including the discussion of self monitoring techniques, problem-solving barriers, and SMART goal setting techniques.  Specifically, regarding patient's less desirable eating habits and patterns, we employed the technique of small changes when  Sean Kennedy has not been able to fully commit to his prudent nutritional plan.  6. Obesity with current BMI 37.7  Course: Sean Kennedy is currently in the action stage of change. As such, his goal is to continue with weight loss efforts.   Nutrition goals: He has agreed to the Category 4 Plan.   Exercise goals: For substantial health benefits, adults should do at least 150 minutes (2 hours and 30 minutes) a week of moderate-intensity, or 75 minutes (1 hour and 15 minutes) a week of vigorous-intensity aerobic physical activity, or an equivalent combination of moderate- and vigorous-intensity aerobic activity. Aerobic activity should be performed in episodes of at least 10 minutes, and preferably, it should be spread throughout the week.  Behavioral modification strategies: increasing lean protein intake, decreasing simple carbohydrates, increasing vegetables, and increasing water intake.  Sean Kennedy has agreed to follow-up with our clinic in 3 weeks. He was informed of the importance of frequent follow-up visits to maximize his success with intensive lifestyle modifications for his multiple health conditions.   Objective:   Blood pressure (!) 142/92, pulse 78, temperature 98.5 F (36.9 C), temperature source Oral, height 5\' 10"  (1.778 m), weight 262 lb (118.8 kg), SpO2 99 %. Body mass index is 37.59 kg/m.  General: Cooperative, alert, well developed, in no acute distress. HEENT: Conjunctivae and lids unremarkable. Cardiovascular: Regular rhythm.  Lungs: Normal work of breathing. Neurologic: No focal deficits.   Lab Results  Component Value Date   CREATININE 1.27 04/20/2020   BUN 10 04/20/2020   NA 138 04/20/2020   K 3.9 04/20/2020   CL 99 04/20/2020   CO2 24 04/20/2020   Lab Results  Component Value Date   ALT 33 04/20/2020   AST 29 04/20/2020   ALKPHOS 70 04/20/2020   BILITOT 0.3 04/20/2020   Lab Results  Component Value Date   HGBA1C 5.8 (H) 04/20/2020   Lab Results  Component Value  Date   INSULIN 10.3 04/20/2020   Lab Results  Component Value Date   TSH 1.550 04/20/2020   Lab Results  Component Value Date   CHOL 184 04/20/2020   HDL 50 04/20/2020   LDLCALC 118 (H) 04/20/2020   TRIG 86 04/20/2020   CHOLHDL 3.1 03/11/2019   Lab Results  Component Value Date   WBC 3.7 04/20/2020   HGB 15.2 04/20/2020   HCT 45.3 04/20/2020   MCV 80 04/20/2020   PLT 236 04/20/2020   Attestation Statements:   Reviewed by clinician on day of visit: allergies, medications, problem list, medical history, surgical history, family history, social history, and previous encounter notes.  I, 06/18/2020, CMA, am acting as transcriptionist for Insurance claims handler, DO  I have reviewed the above documentation for accuracy and completeness, and I agree with the above. Helane Rima, DO

## 2020-10-26 DIAGNOSIS — M25511 Pain in right shoulder: Secondary | ICD-10-CM | POA: Diagnosis not present

## 2020-10-26 DIAGNOSIS — S161XXA Strain of muscle, fascia and tendon at neck level, initial encounter: Secondary | ICD-10-CM | POA: Diagnosis not present

## 2020-10-26 DIAGNOSIS — M19011 Primary osteoarthritis, right shoulder: Secondary | ICD-10-CM | POA: Diagnosis not present

## 2020-10-26 DIAGNOSIS — S46911A Strain of unspecified muscle, fascia and tendon at shoulder and upper arm level, right arm, initial encounter: Secondary | ICD-10-CM | POA: Diagnosis not present

## 2020-10-26 DIAGNOSIS — M19019 Primary osteoarthritis, unspecified shoulder: Secondary | ICD-10-CM | POA: Insufficient documentation

## 2020-10-27 ENCOUNTER — Ambulatory Visit (INDEPENDENT_AMBULATORY_CARE_PROVIDER_SITE_OTHER): Payer: BC Managed Care – PPO | Admitting: Family Medicine

## 2020-10-28 ENCOUNTER — Ambulatory Visit (INDEPENDENT_AMBULATORY_CARE_PROVIDER_SITE_OTHER): Payer: BC Managed Care – PPO | Admitting: Family Medicine

## 2020-11-06 DIAGNOSIS — M7541 Impingement syndrome of right shoulder: Secondary | ICD-10-CM | POA: Diagnosis not present

## 2020-11-12 ENCOUNTER — Encounter (INDEPENDENT_AMBULATORY_CARE_PROVIDER_SITE_OTHER): Payer: Self-pay | Admitting: Family Medicine

## 2020-11-12 ENCOUNTER — Ambulatory Visit (INDEPENDENT_AMBULATORY_CARE_PROVIDER_SITE_OTHER): Payer: BC Managed Care – PPO | Admitting: Family Medicine

## 2020-11-12 ENCOUNTER — Other Ambulatory Visit: Payer: Self-pay

## 2020-11-12 VITALS — BP 137/88 | HR 89 | Temp 98.3°F | Ht 70.0 in | Wt 263.0 lb

## 2020-11-12 DIAGNOSIS — I10 Essential (primary) hypertension: Secondary | ICD-10-CM

## 2020-11-12 DIAGNOSIS — E559 Vitamin D deficiency, unspecified: Secondary | ICD-10-CM | POA: Diagnosis not present

## 2020-11-12 DIAGNOSIS — R7303 Prediabetes: Secondary | ICD-10-CM | POA: Diagnosis not present

## 2020-11-12 DIAGNOSIS — Z9189 Other specified personal risk factors, not elsewhere classified: Secondary | ICD-10-CM | POA: Diagnosis not present

## 2020-11-12 DIAGNOSIS — Z6839 Body mass index (BMI) 39.0-39.9, adult: Secondary | ICD-10-CM

## 2020-11-12 MED ORDER — LOSARTAN POTASSIUM-HCTZ 50-12.5 MG PO TABS: 1.0000 | ORAL_TABLET | Freq: Every day | ORAL | 0 refills | Status: DC

## 2020-11-12 MED ORDER — TIRZEPATIDE 2.5 MG/0.5ML ~~LOC~~ SOAJ: 2.5000 mg | SUBCUTANEOUS | 0 refills | Status: DC

## 2020-11-16 NOTE — Progress Notes (Signed)
Chief Complaint:   OBESITY Sean Kennedy is here to discuss his progress with his obesity treatment plan along with follow-up of his obesity related diagnoses. Sean Kennedy is on the Category 4 Plan and states he is following his eating plan approximately 85% of the time. Sean Kennedy states he is walking 20 minutes 2-3 times per week.  Today's visit was #: 10 Starting weight: 267 lbs Starting date: 04/20/2020 Today's weight: 263 lbs Today's date: 11/12/2020 Total lbs lost to date: 4 Total lbs lost since last in-office visit: 0  Interim History: Sean Kennedy has been doing much of the same. He went on a cruise to the [place] for 4 days out of [place]. His wife recently got gastric sleeve. He is doing better on the meal plan now. He is getting all food in. His biggest obstacle is meal plan. He is going to [facility] next month and will be gone for 1 week.  Subjective:   1. Essential hypertension Sean Kennedy is on Hyzaar with control of BP. Pt denies chest pain/chest pressure/headache.  2. Pre-diabetes Ozempic was denied. Sean Kennedy is on Metformin. His last A1c was 5.8.  3. Vitamin D deficiency Sean Kennedy was previously on prescription Vit D. His last Vit D level was 20.1.  4. At risk for side effect of medication Sean Kennedy is at risk for side effects of medication due to starting Sean Kennedy.  Assessment/Plan:   1. Essential hypertension Sean Kennedy is working on healthy weight loss and exercise to improve blood pressure control. We will watch for signs of hypotension as he continues his lifestyle modifications.  Refill- losartan-hydrochlorothiazide (HYZAAR) 50-12.5 MG tablet; Take 1 tablet by mouth daily.  Dispense: 90 tablet; Refill: 0  2. Pre-diabetes Sean Kennedy will start Mounjaro 2.5 mg and stop Metformin. He will continue to work on weight loss, exercise, and decreasing simple carbohydrates to help decrease the risk of diabetes.   Start- tirzepatide [facility]) 2.5 MG/0.5ML Pen; Inject 2.5 mg into the skin once a week.  Patient has savings/ copay card  Dispense: 2 mL; Refill: 0  3. Vitamin D deficiency Low Vitamin D level contributes to fatigue and are associated with obesity, breast, and colon cancer. He agrees to continue to take prescription Vitamin D @50 ,000 IU every week but hold off on refill at this time. He and will follow-up for routine testing of Vitamin D, at least 2-3 times per year to avoid over-replacement. Check labs at next appt.  4. At risk for side effect of medication Sean Kennedy was given approximately 15 minutes of drug side effect counseling today.  We discussed side effect possibility and risk versus benefits. Sean Kennedy agreed to the medication and will contact this office if these side effects are intolerable.  Repetitive spaced learning was employed today to elicit superior memory formation and behavioral change.   5. Obesity with current BMI of 37.8  Sean Kennedy is currently in the action stage of change. As such, his goal is to continue with weight loss efforts. He has agreed to the Category 4 Plan.   Exercise goals: All adults should avoid inactivity. Some physical activity is better than none, and adults who participate in any amount of physical activity gain some health benefits.  Behavioral modification strategies: increasing lean protein intake, meal planning and cooking strategies, keeping healthy foods in the home, and planning for success.  Sean Kennedy has agreed to follow-up with our clinic in 3 weeks, fasting. He was informed of the importance of frequent follow-up visits to maximize his success with intensive lifestyle modifications  for his multiple health conditions.   Objective:   Blood pressure 137/88, pulse 89, temperature 98.3 F (36.8 C), height 5\' 10"  (1.778 m), weight 263 lb (119.3 kg), SpO2 98 %. Body mass index is 37.74 kg/m.  General: Cooperative, alert, well developed, in no acute distress. HEENT: Conjunctivae and lids unremarkable. Cardiovascular: Regular rhythm.  Lungs:  Normal work of breathing. Neurologic: No focal deficits.   Lab Results  Component Value Date   CREATININE 1.27 04/20/2020   BUN 10 04/20/2020   NA 138 04/20/2020   K 3.9 04/20/2020   CL 99 04/20/2020   CO2 24 04/20/2020   Lab Results  Component Value Date   ALT 33 04/20/2020   AST 29 04/20/2020   ALKPHOS 70 04/20/2020   BILITOT 0.3 04/20/2020   Lab Results  Component Value Date   HGBA1C 5.8 (H) 04/20/2020   Lab Results  Component Value Date   INSULIN 10.3 04/20/2020   Lab Results  Component Value Date   TSH 1.550 04/20/2020   Lab Results  Component Value Date   CHOL 184 04/20/2020   HDL 50 04/20/2020   LDLCALC 118 (H) 04/20/2020   TRIG 86 04/20/2020   CHOLHDL 3.1 03/11/2019   Lab Results  Component Value Date   VD25OH 20.1 (L) 04/20/2020   Lab Results  Component Value Date   WBC 3.7 04/20/2020   HGB 15.2 04/20/2020   HCT 45.3 04/20/2020   MCV 80 04/20/2020   PLT 236 04/20/2020    Attestation Statements:   Reviewed by clinician on day of visit: allergies, medications, problem list, medical history, surgical history, family history, social history, and previous encounter notes.  06/18/2020, CMA, am acting as transcriptionist for Edmund Hilda, MD.   I have reviewed the above documentation for accuracy and completeness, and I agree with the above. - Sean Likes, MD

## 2020-11-30 DIAGNOSIS — M25511 Pain in right shoulder: Secondary | ICD-10-CM | POA: Diagnosis not present

## 2020-12-04 DIAGNOSIS — M19011 Primary osteoarthritis, right shoulder: Secondary | ICD-10-CM | POA: Diagnosis not present

## 2020-12-09 ENCOUNTER — Other Ambulatory Visit: Payer: Self-pay

## 2020-12-09 ENCOUNTER — Ambulatory Visit (INDEPENDENT_AMBULATORY_CARE_PROVIDER_SITE_OTHER): Payer: BC Managed Care – PPO | Admitting: Family Medicine

## 2020-12-09 ENCOUNTER — Encounter (INDEPENDENT_AMBULATORY_CARE_PROVIDER_SITE_OTHER): Payer: Self-pay | Admitting: Family Medicine

## 2020-12-09 VITALS — BP 133/83 | HR 74 | Temp 97.9°F | Ht 70.0 in | Wt 254.0 lb

## 2020-12-09 DIAGNOSIS — G8929 Other chronic pain: Secondary | ICD-10-CM

## 2020-12-09 DIAGNOSIS — E7800 Pure hypercholesterolemia, unspecified: Secondary | ICD-10-CM

## 2020-12-09 DIAGNOSIS — I10 Essential (primary) hypertension: Secondary | ICD-10-CM | POA: Diagnosis not present

## 2020-12-09 DIAGNOSIS — E559 Vitamin D deficiency, unspecified: Secondary | ICD-10-CM

## 2020-12-09 DIAGNOSIS — R7303 Prediabetes: Secondary | ICD-10-CM

## 2020-12-09 DIAGNOSIS — R632 Polyphagia: Secondary | ICD-10-CM | POA: Diagnosis not present

## 2020-12-09 DIAGNOSIS — Z6839 Body mass index (BMI) 39.0-39.9, adult: Secondary | ICD-10-CM

## 2020-12-09 DIAGNOSIS — M25511 Pain in right shoulder: Secondary | ICD-10-CM | POA: Diagnosis not present

## 2020-12-09 DIAGNOSIS — Z9189 Other specified personal risk factors, not elsewhere classified: Secondary | ICD-10-CM

## 2020-12-09 MED ORDER — TIRZEPATIDE 5 MG/0.5ML ~~LOC~~ SOAJ
5.0000 mg | SUBCUTANEOUS | 0 refills | Status: DC
Start: 2020-12-09 — End: 2020-12-14

## 2020-12-09 MED ORDER — PHENTERMINE HCL 37.5 MG PO TABS: 37.5000 mg | ORAL_TABLET | Freq: Every day | ORAL | 0 refills | Status: DC

## 2020-12-09 NOTE — Progress Notes (Signed)
Chief Complaint:   OBESITY Sean Kennedy is here to discuss his progress with his obesity treatment plan along with follow-up of his obesity related diagnoses.   Today's visit was #: 11 Starting weight: 267 lbs Starting date: 04/20/2020 Today's weight: 254 lbs Today's date: 12/09/2020 Weight change since last visit: 9 lbs Total lbs lost to date: 13 lbs Body mass index is 36.45 kg/m.  Total weight loss percentage to date: -4.87%  Current Meal Plan: the Category 4 Plan for 80% of the time.  Current Exercise Plan: Walking for 30-40 minutes 2 times per week. Current Anti-Obesity Medications: Mounjaro 2.5 mg subcutaneously weekly. Side effects: None.  Interim History:  [name] says he is tolerating Mounjaro.  He is happy with his progress.  He is having mild dyspepsia at times.  We discussed Pepcid, food choices, and timing.  Assessment/Plan:   1. Polyphagia Not at goal. Current treatment: Mounjaro 2.5 mg subcutaneously weekly. He will continue to focus on protein-rich, low simple carbohydrate foods. We reviewed the importance of hydration, regular exercise for stress reduction, and restorative sleep.  Plan:  Start phentermine 37.5 mg (he will take 1/2 tablet) daily and increase Mounjaro to 5 mg subcutaneously weekly.  He has stopped metformin.  - Start phentermine (ADIPEX-P) 37.5 MG tablet; Take 1 tablet (37.5 mg total) by mouth daily.  Dispense: 30 tablet; Refill: 0 - Increase and refill tirzepatide (MOUNJARO) 5 MG/0.5ML Pen; Inject 5 mg into the skin once a week.  Dispense: 6 mL; Refill: 0  This patient 1) has no evidence of serious cardiovascular disease; 2) does not have serious psychiatric disease or a history of substance abuse; 3) has been informed about weight loss medications that are FDA-approved for long term use whereas phentermine is not.  Patient understands that all anti-obesity medications are contraindicated in pregnancy. Patient denies a history of glaucoma. Patient  understands that long-term use of phentermine is considered off-label use of this medication, however, that the Endocrine Society and recent research supports that long-term use of phentermine does not appear to have detrimental health effects if it is used in an appropriate patient. In addition, a 2019 study published in Obesity Journal on [id number] patients concluded that "recommendations to limit phentermine to less than 3 months do not align with current concepts of pharmacologic treatment of obesity", and that "long term phentermine users experience greater weight loss without apparent increases in cardiovascular risk".  We reviewed potential side effects including insomnia, dry mouth, increased heart rate and blood pressure, and increased anxiety. We reviewed reducing caffeine consumption while taking phentermine, especially if the patient is experiencing side effects. Alternative treatment options were discussed. All questions were answered, and the patient wishes to move forward with this medication.  I have consulted the [place] Controlled Substances Registry for this patient, and feel the risk/benefit ratio today is favorable for proceeding with this prescription for a controlled substance. The patient understands monitoring parameters and red flags.   2. Chronic right shoulder pain [name] has an appointment on September 21st with [facility] for discussion of possible surgery. We will continue to monitor symptoms as they relate to his weight loss journey.  3. Prediabetes Not at goal. Goal is HgbA1c < 5.7.  Medication: Mounjaro 2.5 mg subcutaneously weekly.    Plan:  He will continue to focus on protein-rich, low simple carbohydrate foods. We reviewed the importance of hydration, regular exercise for stress reduction, and restorative sleep.  Will check A1c today.  Lab Results  Component Value  Date   HGBA1C 5.8 (H) 04/20/2020   Lab Results  Component Value Date   INSULIN 10.3 04/20/2020   -  Hemoglobin A1c  4. Essential hypertension At goal. Medications: Hyzaar 50-12.5 mg daily.   Plan: Avoid buying foods that are: processed, frozen, or prepackaged to avoid excess salt. We will watch for signs of hypotension as he continues lifestyle modifications.  Check CMP today.  BP Readings from Last 3 Encounters:  12/09/20 133/83  11/12/20 137/88  10/07/20 (!) 142/92   Lab Results  Component Value Date   CREATININE 1.27 04/20/2020   - Comprehensive metabolic panel  5. Vitamin D deficiency Not at goal.  He is taking vitamin D 50,000 IU weekly.  Plan: Continue to take prescription Vitamin D @50 ,000 IU every week as prescribed.  Will check vitamin D level today.  Lab Results  Component Value Date   VD25OH 20.1 (L) 04/20/2020   - VITAMIN D 25 Hydroxy (Vit-D Deficiency, Fractures)  6. Pure hypercholesterolemia Course: Improving, but not optimized. Lipid-lowering medications: None.   Plan: Dietary changes: Increase soluble fiber, decrease simple carbohydrates, decrease saturated fat. Exercise changes: Moderate to vigorous-intensity aerobic activity 150 minutes per week or as tolerated. We will continue to monitor along with PCP/specialists as it pertains to his weight loss journey.  Will check lipid panel today.  Lab Results  Component Value Date   CHOL 184 04/20/2020   HDL 50 04/20/2020   LDLCALC 118 (H) 04/20/2020   TRIG 86 04/20/2020   CHOLHDL 3.1 03/11/2019   Lab Results  Component Value Date   ALT 33 04/20/2020   AST 29 04/20/2020   ALKPHOS 70 04/20/2020   BILITOT 0.3 04/20/2020   - Lipid panel  7. At risk for nausea [name] is at risk for nausea due to increasing his Mounjaro dose.  We reviewed ways to decrease side effect of nausea through eating small meals, avoiding high sugar or fat foods, and ultimately going back on the dose if not tolerating. Time > 8 minutes.   8. Obesity with current BMI of 36.4  Course: [name] is currently in the action stage of  change. As such, his goal is to continue with weight loss efforts.   Nutrition goals: He has agreed to the Category 4 Plan.  Okay to decrease to Category 3 if unable to tolerate all food.  Focus on protein.   Exercise goals:  As is.  Behavioral modification strategies: increasing lean protein intake, decreasing simple carbohydrates, increasing vegetables, and increasing water intake.  Elijah has agreed to follow-up with our clinic in 4 weeks. He was informed of the importance of frequent follow-up visits to maximize his success with intensive lifestyle modifications for his multiple health conditions.   Objective:   Blood pressure 133/83, pulse 74, temperature 97.9 F (36.6 C), temperature source Oral, height 5\' 10"  (1.778 m), weight 254 lb (115.2 kg), SpO2 98 %. Body mass index is 36.45 kg/m.  General: Cooperative, alert, well developed, in no acute distress. HEENT: Conjunctivae and lids unremarkable. Cardiovascular: Regular rhythm.  Lungs: Normal work of breathing. Neurologic: No focal deficits.   Lab Results  Component Value Date   CREATININE 1.27 04/20/2020   BUN 10 04/20/2020   NA 138 04/20/2020   K 3.9 04/20/2020   CL 99 04/20/2020   CO2 24 04/20/2020   Lab Results  Component Value Date   ALT 33 04/20/2020   AST 29 04/20/2020   ALKPHOS 70 04/20/2020   BILITOT 0.3 04/20/2020   Lab  Results  Component Value Date   HGBA1C 5.8 (H) 04/20/2020   Lab Results  Component Value Date   INSULIN 10.3 04/20/2020   Lab Results  Component Value Date   TSH 1.550 04/20/2020   Lab Results  Component Value Date   CHOL 184 04/20/2020   HDL 50 04/20/2020   LDLCALC 118 (H) 04/20/2020   TRIG 86 04/20/2020   CHOLHDL 3.1 03/11/2019   Lab Results  Component Value Date   VD25OH 20.1 (L) 04/20/2020   Lab Results  Component Value Date   WBC 3.7 04/20/2020   HGB 15.2 04/20/2020   HCT 45.3 04/20/2020   MCV 80 04/20/2020   PLT 236 04/20/2020   Attestation Statements:    Reviewed by clinician on day of visit: allergies, medications, problem list, medical history, surgical history, family history, social history, and previous encounter notes.  I, Insurance claims handler, CMA, am acting as transcriptionist for Helane Rima, DO  I have reviewed the above documentation for accuracy and completeness, and I agree with the above. Helane Rima, DO

## 2020-12-10 LAB — COMPREHENSIVE METABOLIC PANEL
ALT: 32 IU/L (ref 0–44)
AST: 33 IU/L (ref 0–40)
Albumin/Globulin Ratio: 1.7 (ref 1.2–2.2)
Albumin: 4.7 g/dL (ref 4.0–5.0)
Alkaline Phosphatase: 71 IU/L (ref 44–121)
BUN/Creatinine Ratio: 11 (ref 9–20)
BUN: 14 mg/dL (ref 6–20)
Bilirubin Total: 0.3 mg/dL (ref 0.0–1.2)
CO2: 22 mmol/L (ref 20–29)
Calcium: 9.7 mg/dL (ref 8.7–10.2)
Chloride: 100 mmol/L (ref 96–106)
Creatinine, Ser: 1.3 mg/dL — ABNORMAL HIGH (ref 0.76–1.27)
Globulin, Total: 2.8 g/dL (ref 1.5–4.5)
Glucose: 83 mg/dL (ref 65–99)
Potassium: 4.3 mmol/L (ref 3.5–5.2)
Sodium: 140 mmol/L (ref 134–144)
Total Protein: 7.5 g/dL (ref 6.0–8.5)
eGFR: 72 mL/min/{1.73_m2} (ref >59)

## 2020-12-10 LAB — LIPID PANEL
Chol/HDL Ratio: 3.8 ratio (ref 0.0–5.0)
Cholesterol, Total: 169 mg/dL (ref 100–199)
HDL: 45 mg/dL (ref >39)
LDL Chol Calc (NIH): 110 mg/dL — ABNORMAL HIGH (ref 0–99)
Triglycerides: 76 mg/dL (ref 0–149)
VLDL Cholesterol Cal: 14 mg/dL (ref 5–40)

## 2020-12-10 LAB — HEMOGLOBIN A1C
Est. average glucose Bld gHb Est-mCnc: 117 mg/dL
Hgb A1c MFr Bld: 5.7 % — ABNORMAL HIGH (ref 4.8–5.6)

## 2020-12-10 LAB — VITAMIN D 25 HYDROXY (VIT D DEFICIENCY, FRACTURES): Vit D, 25-Hydroxy: 43.3 ng/mL (ref 30.0–100.0)

## 2020-12-12 ENCOUNTER — Other Ambulatory Visit (INDEPENDENT_AMBULATORY_CARE_PROVIDER_SITE_OTHER): Payer: Self-pay | Admitting: Family Medicine

## 2020-12-12 DIAGNOSIS — R7303 Prediabetes: Secondary | ICD-10-CM

## 2020-12-14 ENCOUNTER — Encounter (INDEPENDENT_AMBULATORY_CARE_PROVIDER_SITE_OTHER): Payer: Self-pay

## 2020-12-14 DIAGNOSIS — M25511 Pain in right shoulder: Secondary | ICD-10-CM | POA: Diagnosis not present

## 2020-12-14 DIAGNOSIS — M19011 Primary osteoarthritis, right shoulder: Secondary | ICD-10-CM | POA: Diagnosis not present

## 2020-12-14 DIAGNOSIS — S46911A Strain of unspecified muscle, fascia and tendon at shoulder and upper arm level, right arm, initial encounter: Secondary | ICD-10-CM | POA: Diagnosis not present

## 2020-12-14 MED ORDER — TIRZEPATIDE 5 MG/0.5ML ~~LOC~~ SOAJ: 5.0000 mg | SUBCUTANEOUS | 0 refills | Status: DC

## 2020-12-14 NOTE — Telephone Encounter (Signed)
Dr.Wallace °

## 2020-12-15 ENCOUNTER — Other Ambulatory Visit (INDEPENDENT_AMBULATORY_CARE_PROVIDER_SITE_OTHER): Payer: Self-pay

## 2020-12-15 DIAGNOSIS — R632 Polyphagia: Secondary | ICD-10-CM

## 2020-12-15 MED ORDER — TIRZEPATIDE 5 MG/0.5ML ~~LOC~~ SOAJ: 5.0000 mg | SUBCUTANEOUS | 0 refills | Status: DC

## 2021-01-01 DIAGNOSIS — M19011 Primary osteoarthritis, right shoulder: Secondary | ICD-10-CM | POA: Insufficient documentation

## 2021-01-06 ENCOUNTER — Other Ambulatory Visit (INDEPENDENT_AMBULATORY_CARE_PROVIDER_SITE_OTHER): Payer: Self-pay | Admitting: Family Medicine

## 2021-01-06 DIAGNOSIS — I10 Essential (primary) hypertension: Secondary | ICD-10-CM

## 2021-01-06 DIAGNOSIS — M25511 Pain in right shoulder: Secondary | ICD-10-CM | POA: Diagnosis not present

## 2021-01-06 NOTE — Telephone Encounter (Signed)
Last OV with Dr Wallace 

## 2021-01-13 ENCOUNTER — Other Ambulatory Visit: Payer: Self-pay

## 2021-01-13 ENCOUNTER — Ambulatory Visit (INDEPENDENT_AMBULATORY_CARE_PROVIDER_SITE_OTHER): Payer: BC Managed Care – PPO | Admitting: Family Medicine

## 2021-01-13 ENCOUNTER — Encounter (INDEPENDENT_AMBULATORY_CARE_PROVIDER_SITE_OTHER): Payer: Self-pay | Admitting: Family Medicine

## 2021-01-13 ENCOUNTER — Other Ambulatory Visit (INDEPENDENT_AMBULATORY_CARE_PROVIDER_SITE_OTHER): Payer: Self-pay | Admitting: Family Medicine

## 2021-01-13 VITALS — BP 132/84 | HR 95 | Temp 98.1°F | Ht 70.0 in | Wt 240.0 lb

## 2021-01-13 DIAGNOSIS — R632 Polyphagia: Secondary | ICD-10-CM

## 2021-01-13 DIAGNOSIS — Z6839 Body mass index (BMI) 39.0-39.9, adult: Secondary | ICD-10-CM

## 2021-01-13 DIAGNOSIS — E559 Vitamin D deficiency, unspecified: Secondary | ICD-10-CM

## 2021-01-13 DIAGNOSIS — Z9189 Other specified personal risk factors, not elsewhere classified: Secondary | ICD-10-CM

## 2021-01-13 DIAGNOSIS — E66812 Obesity, class 2: Secondary | ICD-10-CM

## 2021-01-13 MED ORDER — TIRZEPATIDE 5 MG/0.5ML ~~LOC~~ SOAJ
5.0000 mg | SUBCUTANEOUS | 0 refills | Status: DC
  Filled 2021-01-13: qty 2, 28d supply, fill #0
  Filled 2021-01-13: qty 6, 84d supply, fill #0

## 2021-01-13 MED ORDER — VITAMIN D (ERGOCALCIFEROL) 1.25 MG (50000 UNIT) PO CAPS: 50000.0000 [IU] | ORAL_CAPSULE | ORAL | 0 refills | Status: DC

## 2021-01-13 MED ORDER — PHENTERMINE HCL 37.5 MG PO TABS: 37.5000 mg | ORAL_TABLET | Freq: Every day | ORAL | 0 refills | Status: DC

## 2021-01-13 NOTE — Progress Notes (Signed)
Chief Complaint:   OBESITY Haze is here to discuss his progress with his obesity treatment plan along with follow-up of his obesity related diagnoses. Sean Kennedy is on the Category 3 Plan and the Category 4 Plan and states he is following his eating plan approximately 90% of the time. Sean Kennedy states he is walking 30 minutes 2-3 times per week.  Today's visit was #: 12 Starting weight: 267 lbs Starting date: 04/20/2020 Today's weight: 240 lbs Today's date: 01/13/2021 Total lbs lost to date: 27 Total lbs lost since last in-office visit: 14  Interim History: Sean Kennedy voices that he's doing pretty good on meal plan for the most part. He is eating banana and yogurt in the morning; For lunch he is eating a frozen meal and dinner may be another frozen meal; Snack may be cheese and nuts. Pt denies hunger. He has wedding anniversary this weekend and is going to [place].   Subjective:   1. Vitamin D deficiency Sean Kennedy denies nausea, vomiting, and muscle weakness but notes fatigue. Pt is on prescription Vit D.  2. Polyphagia Pt is on phentermine and Mounjaro (stopped Topamax). He has a significant decline in drive to eat; likely not getting in 1200 cal/day. PDMP checked today.  3. At risk for deficient intake of food Sean Kennedy is at risk for deficient intake of food due to likely only getting about 1200 calories per day at best.  Assessment/Plan:   1. Vitamin D deficiency Low Vitamin D level contributes to fatigue and are associated with obesity, breast, and colon cancer. He agrees to continue to take prescription Vitamin D 50,000 IU every week and will follow-up for routine testing of Vitamin D, at least 2-3 times per year to avoid over-replacement. Continue current treatment plan.  Refill- Vitamin D, Ergocalciferol, (DRISDOL) 1.25 MG (50000 UNIT) CAPS capsule; Take 1 capsule (50,000 Units total) by mouth every 7 (seven) days.  Dispense: 4 capsule; Refill: 0  2. Polyphagia Intensive lifestyle  modifications are the first line treatment for this issue. We discussed several lifestyle modifications today and he will continue to work on diet, exercise and weight loss efforts. Orders and follow up as documented in patient record.  Counseling Polyphagia is excessive hunger. Causes can include: low blood sugars, hypERthyroidism, PMS, lack of sleep, stress, insulin resistance, diabetes, certain medications, and diets that are deficient in protein and fiber.   Refill- tirzepatide (MOUNJARO) 5 MG/0.5ML Pen; Inject 5 mg into the skin once a week.  Dispense: 6 mL; Refill: 0 Refill- phentermine (ADIPEX-P) 37.5 MG tablet; Take 1 tablet (37.5 mg total) by mouth daily.  Dispense: 30 tablet; Refill: 0  3. At risk for deficient intake of food Taren was given approximately 15 minutes of deficit intake of food prevention counseling today. Sean Kennedy is at risk for eating too few calories based on current food recall. He was encouraged to focus on meeting caloric and protein goals according to his recommended meal plan.   4. Obesity with current BMI of 34.4  Sean Kennedy is currently in the action stage of change. As such, his goal is to continue with weight loss efforts. He has agreed to keeping a food journal and adhering to recommended goals of 1450-1600 calories and 120+ grams protein.   Pt is to increase protein amount and quantity of food- likely only getting about 1000 calories currently.  Exercise goals: All adults should avoid inactivity. Some physical activity is better than none, and adults who participate in any amount of physical activity gain  some health benefits.  Behavioral modification strategies: increasing lean protein intake, no skipping meals, and meal planning and cooking strategies.  Sean Kennedy has agreed to follow-up with our clinic in 3-4 weeks. He was informed of the importance of frequent follow-up visits to maximize his success with intensive lifestyle modifications for his multiple health  conditions.   Objective:   Blood pressure 132/84, pulse 95, temperature 98.1 F (36.7 C), height 5\' 10"  (1.778 m), weight 240 lb (108.9 kg), SpO2 98 %. Body mass index is 34.44 kg/m.  General: Cooperative, alert, well developed, in no acute distress. HEENT: Conjunctivae and lids unremarkable. Cardiovascular: Regular rhythm.  Lungs: Normal work of breathing. Neurologic: No focal deficits.   Lab Results  Component Value Date   CREATININE 1.30 (H) 12/09/2020   BUN 14 12/09/2020   NA 140 12/09/2020   K 4.3 12/09/2020   CL 100 12/09/2020   CO2 22 12/09/2020   Lab Results  Component Value Date   ALT 32 12/09/2020   AST 33 12/09/2020   ALKPHOS 71 12/09/2020   BILITOT 0.3 12/09/2020   Lab Results  Component Value Date   HGBA1C 5.7 (H) 12/09/2020   HGBA1C 5.8 (H) 04/20/2020   Lab Results  Component Value Date   INSULIN 10.3 04/20/2020   Lab Results  Component Value Date   TSH 1.550 04/20/2020   Lab Results  Component Value Date   CHOL 169 12/09/2020   HDL 45 12/09/2020   LDLCALC 110 (H) 12/09/2020   TRIG 76 12/09/2020   CHOLHDL 3.8 12/09/2020   Lab Results  Component Value Date   VD25OH 43.3 12/09/2020   VD25OH 20.1 (L) 04/20/2020   Lab Results  Component Value Date   WBC 3.7 04/20/2020   HGB 15.2 04/20/2020   HCT 45.3 04/20/2020   MCV 80 04/20/2020   PLT 236 04/20/2020    Attestation Statements:   Reviewed by clinician on day of visit: allergies, medications, problem list, medical history, surgical history, family history, social history, and previous encounter notes.  06/18/2020, CMA, am acting as transcriptionist for Edmund Hilda, MD.  I have reviewed the above documentation for accuracy and completeness, and I agree with the above. - Reuben Likes, MD

## 2021-02-04 ENCOUNTER — Ambulatory Visit (INDEPENDENT_AMBULATORY_CARE_PROVIDER_SITE_OTHER): Payer: BC Managed Care – PPO | Admitting: Family Medicine

## 2021-02-09 ENCOUNTER — Other Ambulatory Visit: Payer: Self-pay

## 2021-02-09 ENCOUNTER — Ambulatory Visit (INDEPENDENT_AMBULATORY_CARE_PROVIDER_SITE_OTHER): Payer: BC Managed Care – PPO | Admitting: Family Medicine

## 2021-02-09 ENCOUNTER — Encounter (INDEPENDENT_AMBULATORY_CARE_PROVIDER_SITE_OTHER): Payer: Self-pay | Admitting: Family Medicine

## 2021-02-09 VITALS — BP 146/100 | HR 90 | Temp 98.1°F | Ht 70.0 in | Wt 235.0 lb

## 2021-02-09 DIAGNOSIS — R632 Polyphagia: Secondary | ICD-10-CM | POA: Diagnosis not present

## 2021-02-09 DIAGNOSIS — Z6839 Body mass index (BMI) 39.0-39.9, adult: Secondary | ICD-10-CM

## 2021-02-09 DIAGNOSIS — E559 Vitamin D deficiency, unspecified: Secondary | ICD-10-CM

## 2021-02-09 MED ORDER — TIRZEPATIDE 5 MG/0.5ML ~~LOC~~ SOAJ
5.0000 mg | SUBCUTANEOUS | 0 refills | Status: DC
  Filled 2021-02-09: qty 2, 28d supply, fill #0

## 2021-02-09 MED ORDER — PHENTERMINE HCL 37.5 MG PO TABS
37.5000 mg | ORAL_TABLET | Freq: Every day | ORAL | 0 refills | Status: DC
Start: 2021-02-09 — End: 2021-04-08
  Filled 2021-02-09 – 2021-02-10 (×2): qty 30, 30d supply, fill #0

## 2021-02-09 MED ORDER — VITAMIN D (ERGOCALCIFEROL) 1.25 MG (50000 UNIT) PO CAPS
50000.0000 [IU] | ORAL_CAPSULE | ORAL | 0 refills | Status: DC
  Filled 2021-02-09: qty 4, 28d supply, fill #0
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
## 2021-02-10 ENCOUNTER — Other Ambulatory Visit: Payer: Self-pay

## 2021-03-07 ENCOUNTER — Other Ambulatory Visit (INDEPENDENT_AMBULATORY_CARE_PROVIDER_SITE_OTHER): Payer: Self-pay | Admitting: Family Medicine

## 2021-03-07 DIAGNOSIS — F3289 Other specified depressive episodes: Secondary | ICD-10-CM

## 2021-03-08 ENCOUNTER — Other Ambulatory Visit: Payer: Self-pay

## 2021-03-08 ENCOUNTER — Encounter (INDEPENDENT_AMBULATORY_CARE_PROVIDER_SITE_OTHER): Payer: Self-pay | Admitting: Family Medicine

## 2021-03-08 ENCOUNTER — Other Ambulatory Visit (INDEPENDENT_AMBULATORY_CARE_PROVIDER_SITE_OTHER): Payer: Self-pay | Admitting: Family Medicine

## 2021-03-08 ENCOUNTER — Ambulatory Visit (INDEPENDENT_AMBULATORY_CARE_PROVIDER_SITE_OTHER): Payer: BC Managed Care – PPO | Admitting: Family Medicine

## 2021-03-08 ENCOUNTER — Other Ambulatory Visit (INDEPENDENT_AMBULATORY_CARE_PROVIDER_SITE_OTHER): Payer: Self-pay

## 2021-03-08 VITALS — HR 96 | Temp 97.6°F | Ht 70.0 in | Wt 229.0 lb

## 2021-03-08 DIAGNOSIS — E559 Vitamin D deficiency, unspecified: Secondary | ICD-10-CM | POA: Diagnosis not present

## 2021-03-08 DIAGNOSIS — R632 Polyphagia: Secondary | ICD-10-CM

## 2021-03-08 DIAGNOSIS — I10 Essential (primary) hypertension: Secondary | ICD-10-CM | POA: Diagnosis not present

## 2021-03-08 DIAGNOSIS — Z6841 Body Mass Index (BMI) 40.0 and over, adult: Secondary | ICD-10-CM

## 2021-03-08 MED ORDER — CHLORTHALIDONE 25 MG PO TABS
12.5000 mg | ORAL_TABLET | Freq: Every day | ORAL | 0 refills | Status: DC
  Filled 2021-03-08 – 2021-04-08 (×2): qty 30, 60d supply, fill #0

## 2021-03-08 MED ORDER — CHLORTHALIDONE 25 MG PO TABS: 12.5000 mg | ORAL_TABLET | Freq: Every day | ORAL | 0 refills | Status: DC

## 2021-03-08 MED ORDER — TIRZEPATIDE 5 MG/0.5ML ~~LOC~~ SOAJ: 5.0000 mg | SUBCUTANEOUS | 0 refills | Status: DC

## 2021-03-08 MED ORDER — TIRZEPATIDE 5 MG/0.5ML ~~LOC~~ SOAJ
5.0000 mg | SUBCUTANEOUS | 0 refills | Status: DC
  Filled 2021-03-08: qty 2, 28d supply, fill #0

## 2021-03-08 NOTE — Progress Notes (Signed)
Chief Complaint:   OBESITY Sean Kennedy is here to discuss his progress with his obesity treatment plan along with follow-up of his obesity related diagnoses. Sean Kennedy is on keeping a food journal and adhering to recommended goals of 1450-1600 calories and 120+ grams protein and states he is following his eating plan approximately 80% of the time. Sean Kennedy states he is working in the yard 4-6 hours 4 times per week.  Today's visit was #: 14 Starting weight: 267 lbs Starting date: 04/20/2020 Today's weight: 229 lbs Today's date: 03/08/2021 Total lbs lost to date: 38 Total lbs lost since last in-office visit: 6  Interim History: Sean Kennedy has been doing mostly yard work. He is trying to get more food in. Pt is doing biscuit combo with coffee; Lunch is a meal and maybe a soda and supper is protein and vegetables (occasionally). He is hosting Thanksgiving but is unsure of plans for December. He is doing well on all meds except Vit D.  Subjective:   1. Essential hypertension BP elevated today. Pt denies chest pain/chest pressure/headache. He was previously on Hyzaar.  2. Vitamin D deficiency Sean Kennedy is on prescription Vit D but often forgets to take it. He reports fatigue.  3. Polyphagia He is on Mounjaro 5 mg and denies GI side effects.  Assessment/Plan:   1. Essential hypertension Sean Kennedy is working on healthy weight loss and exercise to improve blood pressure control. We will watch for signs of hypotension as he continues his lifestyle modifications.   Start chlorthalidone 12.5 mg and follow up BP at next appt.   2. Vitamin D deficiency Low Vitamin D level contributes to fatigue and are associated with obesity, breast, and colon cancer. He agrees to change to OTC Vitamin D 5,000 IU daily and will follow-up for routine testing of Vitamin D, at least 2-3 times per year to avoid over-replacement.  3. Polyphagia Intensive lifestyle modifications are the first line treatment for this issue. We  discussed several lifestyle modifications today and he will continue to work on diet, exercise and weight loss efforts. Orders and follow up as documented in patient record.  Refill Mounjaro 5 mg weekly.  Counseling Polyphagia is excessive hunger. Causes can include: low blood sugars, hypERthyroidism, PMS, lack of sleep, stress, insulin resistance, diabetes, certain medications, and diets that are deficient in protein and fiber.   4. Obesity BMI today is 59  Sean Kennedy is currently in the action stage of change. As such, his goal is to continue with weight loss efforts. He has agreed to keeping a food journal and adhering to recommended goals of 1450-1600 calories and 120+ grams protein.   Exercise goals: All adults should avoid inactivity. Some physical activity is better than none, and adults who participate in any amount of physical activity gain some health benefits.  Behavioral modification strategies: increasing lean protein intake, meal planning and cooking strategies, keeping healthy foods in the home, and planning for success.  Sean Kennedy has agreed to follow-up with our clinic in 3-4 weeks. He was informed of the importance of frequent follow-up visits to maximize his success with intensive lifestyle modifications for his multiple health conditions.   Objective:   Pulse 96, temperature 97.6 F (36.4 C), height 5\' 10"  (1.778 m), weight 229 lb (103.9 kg), SpO2 99 %. Body mass index is 32.86 kg/m.  General: Cooperative, alert, well developed, in no acute distress. HEENT: Conjunctivae and lids unremarkable. Cardiovascular: Regular rhythm.  Lungs: Normal work of breathing. Neurologic: No focal deficits.  Lab Results  Component Value Date   CREATININE 1.30 (H) 12/09/2020   BUN 14 12/09/2020   NA 140 12/09/2020   K 4.3 12/09/2020   CL 100 12/09/2020   CO2 22 12/09/2020   Lab Results  Component Value Date   ALT 32 12/09/2020   AST 33 12/09/2020   ALKPHOS 71 12/09/2020   BILITOT  0.3 12/09/2020   Lab Results  Component Value Date   HGBA1C 5.7 (H) 12/09/2020   HGBA1C 5.8 (H) 04/20/2020   Lab Results  Component Value Date   INSULIN 10.3 04/20/2020   Lab Results  Component Value Date   TSH 1.550 04/20/2020   Lab Results  Component Value Date   CHOL 169 12/09/2020   HDL 45 12/09/2020   LDLCALC 110 (H) 12/09/2020   TRIG 76 12/09/2020   CHOLHDL 3.8 12/09/2020   Lab Results  Component Value Date   VD25OH 43.3 12/09/2020   VD25OH 20.1 (L) 04/20/2020   Lab Results  Component Value Date   WBC 3.7 04/20/2020   HGB 15.2 04/20/2020   HCT 45.3 04/20/2020   MCV 80 04/20/2020   PLT 236 04/20/2020    Attestation Statements:   Reviewed by clinician on day of visit: allergies, medications, problem list, medical history, surgical history, family history, social history, and previous encounter notes.  Edmund Hilda, CMA, am acting as transcriptionist for Reuben Likes, MD.   I have reviewed the above documentation for accuracy and completeness, and I agree with the above. - Reuben Likes, MD

## 2021-04-05 ENCOUNTER — Ambulatory Visit (INDEPENDENT_AMBULATORY_CARE_PROVIDER_SITE_OTHER): Payer: BC Managed Care – PPO | Admitting: Family Medicine

## 2021-04-08 ENCOUNTER — Other Ambulatory Visit: Payer: Self-pay

## 2021-04-08 ENCOUNTER — Encounter (INDEPENDENT_AMBULATORY_CARE_PROVIDER_SITE_OTHER): Payer: Self-pay | Admitting: Family Medicine

## 2021-04-08 ENCOUNTER — Ambulatory Visit (INDEPENDENT_AMBULATORY_CARE_PROVIDER_SITE_OTHER): Payer: BC Managed Care – PPO | Admitting: Family Medicine

## 2021-04-08 VITALS — BP 156/105 | HR 94 | Temp 98.1°F | Ht 70.0 in | Wt 228.0 lb

## 2021-04-08 DIAGNOSIS — Z9189 Other specified personal risk factors, not elsewhere classified: Secondary | ICD-10-CM

## 2021-04-08 DIAGNOSIS — Z6839 Body mass index (BMI) 39.0-39.9, adult: Secondary | ICD-10-CM | POA: Diagnosis not present

## 2021-04-08 DIAGNOSIS — R632 Polyphagia: Secondary | ICD-10-CM

## 2021-04-08 DIAGNOSIS — I10 Essential (primary) hypertension: Secondary | ICD-10-CM

## 2021-04-08 MED ORDER — MOUNJARO 7.5 MG/0.5ML ~~LOC~~ SOAJ
7.5000 mg | SUBCUTANEOUS | 0 refills | Status: DC
  Filled 2021-04-08: qty 2, 28d supply, fill #0

## 2021-04-08 NOTE — Progress Notes (Signed)
Chief Complaint:   OBESITY Sean Kennedy is here to discuss his progress with his obesity treatment plan along with follow-up of his obesity related diagnoses. Sean Kennedy is on keeping a food journal and adhering to recommended goals of 1450-1600 calories and 120+ grams of protein daily and states he is following his eating plan approximately 70% of the time. Sean Kennedy states he is doing 0 minutes 0 times per week.  Today's visit was #: 15 Starting weight: 267 lbs Starting date: 04/20/2020 Today's weight: 228 lbs Today's date: 04/08/2021 Total lbs lost to date: 39 Total lbs lost since last in-office visit: 1  Interim History: Sean Kennedy has done well with weight loss in the last month despite extra challenges at work, and decreased sleep as well as holiday temptations. His blood pressure is elevated again today while on phentermine.  Subjective:   1. Polyphagia Sean Kennedy is on [place] and he is working on diet and weight loss. He is tolerating Mounjaro well, but he will be discontinuing phentermine so his polyphagia may increase.  2. Essential hypertension Sean Kennedy's blood pressure is elevated, and he is on phentermine. He hasn't started chlorthalidone yet. He notes increased stress at work with long hours and decreased sleep.  3. At risk for heart disease Sean Kennedy is at a higher than average risk for cardiovascular disease due to obesity.   Assessment/Plan:   1. Sean Kennedy agreed to increase Mounjaro to 7.5 mg q week, and we will refill for 1 month. He will continue to work on diet, exercise and weight loss efforts. Orders and follow up as documented in patient record.  - tirzepatide (MOUNJARO) 7.5 MG/0.5ML Pen; Inject 7.5 mg into the skin once a week.  Dispense: 2 mL; Refill: 0  2. Essential hypertension Sean Kennedy agreed to discontinue phentermine, and see if his blood pressure stabilizes. He will continue with diet and exercise to improve blood pressure control. We will recheck his blood pressure in 1  month.  3. At risk for heart disease Sean Kennedy was given approximately 15 minutes of coronary artery disease prevention counseling today. He is 40 y.o. male and has risk factors for heart disease including obesity. We discussed intensive lifestyle modifications today with an emphasis on specific weight loss instructions and strategies.   Repetitive spaced learning was employed today to elicit superior memory formation and behavioral change.  4. Obesity with current BMI of 32.8 Sean Kennedy is currently in the action stage of change. As such, his goal is to continue with weight loss efforts. He has agreed to keeping a food journal and adhering to recommended goals of 1450-1600 calories and 120+ grams of protein daily.   Sean Kennedy agreed to discontinue phentermine and he will continue with diet and exercise. We may consider restarting it if his blood pressure stabilizes.  Sean Kennedy has agreed to follow-up with our clinic in 4 weeks. He was informed of the importance of frequent follow-up visits to maximize his success with intensive lifestyle modifications for his multiple health conditions.   Objective:   Blood pressure (!) 156/105, pulse 94, temperature 98.1 F (36.7 C), height 5\' 10"  (1.778 m), weight 228 lb (103.4 kg), SpO2 100 %. Body mass index is 32.71 kg/m.  General: Cooperative, alert, well developed, in no acute distress. HEENT: Conjunctivae and lids unremarkable. Cardiovascular: Regular rhythm.  Lungs: Normal work of breathing. Neurologic: No focal deficits.   Lab Results  Component Value Date   CREATININE 1.30 (H) 12/09/2020   BUN 14 12/09/2020   NA 140 12/09/2020  K 4.3 12/09/2020   CL 100 12/09/2020   CO2 22 12/09/2020   Lab Results  Component Value Date   ALT 32 12/09/2020   AST 33 12/09/2020   ALKPHOS 71 12/09/2020   BILITOT 0.3 12/09/2020   Lab Results  Component Value Date   HGBA1C 5.7 (H) 12/09/2020   HGBA1C 5.8 (H) 04/20/2020   Lab Results  Component Value Date    INSULIN 10.3 04/20/2020   Lab Results  Component Value Date   TSH 1.550 04/20/2020   Lab Results  Component Value Date   CHOL 169 12/09/2020   HDL 45 12/09/2020   LDLCALC 110 (H) 12/09/2020   TRIG 76 12/09/2020   CHOLHDL 3.8 12/09/2020   Lab Results  Component Value Date   VD25OH 43.3 12/09/2020   VD25OH 20.1 (L) 04/20/2020   Lab Results  Component Value Date   WBC 3.7 04/20/2020   HGB 15.2 04/20/2020   HCT 45.3 04/20/2020   MCV 80 04/20/2020   PLT 236 04/20/2020   No results found for: IRON, TIBC, FERRITIN  Attestation Statements:   Reviewed by clinician on day of visit: allergies, medications, problem list, medical history, surgical history, family history, social history, and previous encounter notes.   I, Burt Knack, am acting as transcriptionist for Quillian Quince, MD.  I have reviewed the above documentation for accuracy and completeness, and I agree with the above. -  Quillian Quince, MD

## 2021-05-03 ENCOUNTER — Ambulatory Visit (INDEPENDENT_AMBULATORY_CARE_PROVIDER_SITE_OTHER): Payer: BC Managed Care – PPO | Admitting: Family Medicine

## 2021-05-03 ENCOUNTER — Encounter: Payer: Self-pay | Admitting: Family Medicine

## 2021-05-03 ENCOUNTER — Other Ambulatory Visit: Payer: Self-pay

## 2021-05-03 VITALS — BP 150/103 | HR 87 | Temp 97.8°F | Resp 16 | Ht 70.0 in | Wt 235.4 lb

## 2021-05-03 DIAGNOSIS — I10 Essential (primary) hypertension: Secondary | ICD-10-CM

## 2021-05-03 DIAGNOSIS — E669 Obesity, unspecified: Secondary | ICD-10-CM | POA: Diagnosis not present

## 2021-05-03 DIAGNOSIS — G4700 Insomnia, unspecified: Secondary | ICD-10-CM | POA: Insufficient documentation

## 2021-05-03 DIAGNOSIS — R7303 Prediabetes: Secondary | ICD-10-CM

## 2021-05-03 DIAGNOSIS — Z6833 Body mass index (BMI) 33.0-33.9, adult: Secondary | ICD-10-CM

## 2021-05-03 DIAGNOSIS — R040 Epistaxis: Secondary | ICD-10-CM | POA: Diagnosis not present

## 2021-05-03 MED ORDER — LOSARTAN POTASSIUM 50 MG PO TABS: 50.0000 mg | ORAL_TABLET | Freq: Every day | ORAL | 5 refills | Status: DC

## 2021-05-03 NOTE — Assessment & Plan Note (Signed)
F/b [facility] On mounjaro Discussed importance of healthy weight management Discussed diet and exercise

## 2021-05-03 NOTE — Assessment & Plan Note (Addendum)
Chronic and stable On Menetrier as above Recheck A1c Encourage low-carb diet

## 2021-05-03 NOTE — Assessment & Plan Note (Signed)
New problem Seems to be anterior nosebleed Discussed importance of moisturizing the area, avoiding picking it, and using humidifier in his room while sleeping

## 2021-05-03 NOTE — Assessment & Plan Note (Signed)
New problem Decrease dose of melatonin to about 3 mg nightly Discussed sleep hygiene

## 2021-05-03 NOTE — Progress Notes (Signed)
Established patient visit   Patient: Sean Kennedy   DOB: 09/05/1980   41 y.o. Male  MRN: [id number] Visit Date: 05/03/2021  Today's healthcare provider: [name], MD   Chief Complaint  Patient presents with   Hypertension   I,Sulibeya S Dimas,acting as a scribe for [name], MD.,have documented all relevant documentation on the behalf of [name], MD,as directed by  [name], MD while in the presence of [name], MD.  Subjective    HPI  Hypertension, follow-up  BP Readings from Last 3 Encounters:  05/03/21 (!) 150/103  04/08/21 (!) 156/105  02/09/21 (!) 146/100   Wt Readings from Last 3 Encounters:  05/03/21 235 lb 6.4 oz (106.8 kg)  04/08/21 228 lb (103.4 kg)  03/08/21 229 lb (103.9 kg)     He was last seen for hypertension 2 months ago.  BP at that visit was 146/100. Management since that visit includes start chlorthalidone 61m half a tablet daily.  He reports excellent compliance with treatment. Patient reports he is taking the full tablet.  He is not having side effects.  He is following a Regular diet. He is not exerising. He does smoke.  Use of agents associated with hypertension: none.   Outside blood pressures are stable. Symptoms: No chest pain No chest pressure  No palpitations No syncope  No dyspnea No orthopnea  No paroxysmal nocturnal dyspnea No lower extremity edema   Pertinent labs: Lab Results  Component Value Date   CHOL 169 12/09/2020   HDL 45 12/09/2020   LDLCALC 110 (H) 12/09/2020   TRIG 76 12/09/2020   CHOLHDL 3.8 12/09/2020   Lab Results  Component Value Date   NA 140 12/09/2020   K 4.3 12/09/2020   CREATININE 1.30 (H) 12/09/2020   EGFR 72 12/09/2020   GLUCOSE 83 12/09/2020   TSH 1.550 04/20/2020     The 10-year ASCVD risk score (Arnett DK, et al., 2019) is: 6.8%   --------------------------------------------------------------------------------------------------- Blood  tinged sputum from R nostril for several months. Sinus pain and congestion x3 days. No nasal sprays.    Medications: Outpatient Medications Prior to Visit  Medication Sig   chlorthalidone (HYGROTON) 25 MG tablet Take 1/2 tablets (12.5 mg total) by mouth daily.   tirzepatide (MOUNJARO) 7.5 MG/0.5ML Pen Inject 7.5 mg into the skin once a week.   meloxicam (MOBIC) 7.5 MG tablet Take 7.5 mg by mouth 2 (two) times daily. (Patient not taking: Reported on 05/03/2021)   [DISCONTINUED] traMADol (ULTRAM) 50 MG tablet Take 50 mg by mouth every 6 (six) hours as needed.   No facility-administered medications prior to visit.    Review of Systems  Constitutional:  Negative for appetite change and fatigue.  Respiratory:  Negative for chest tightness and shortness of breath.   Cardiovascular:  Negative for chest pain and palpitations.       Objective    BP (!) 150/103 (BP Location: Left Arm, Patient Position: Sitting, Cuff Size: Large)    Pulse 87    Temp 97.8 F (36.6 C) (Temporal)    Resp 16    Ht 5' 10"  (1.778 m)    Wt 235 lb 6.4 oz (106.8 kg)    SpO2 100%    BMI 33.78 kg/m  BP Readings from Last 3 Encounters:  05/03/21 (!) 150/103  04/08/21 (!) 156/105  02/09/21 (!) 146/100   Wt Readings from Last 3 Encounters:  05/03/21 235 lb 6.4 oz (106.8 kg)  04/08/21 228 lb (  103.4 kg)  03/08/21 229 lb (103.9 kg)      Physical Exam Vitals reviewed.  Constitutional:      General: He is not in acute distress.    Appearance: Normal appearance. He is not diaphoretic.  HENT:     Head: Normocephalic and atraumatic.     Right Ear: Tympanic membrane, ear canal and external ear normal.     Left Ear: Tympanic membrane, ear canal and external ear normal.     Nose:     Comments: Area of erythema and irritation in R nare laterally, anteriorly Eyes:     General: No scleral icterus.    Conjunctiva/sclera: Conjunctivae normal.  Cardiovascular:     Rate and Rhythm: Normal rate and regular rhythm.      Pulses: Normal pulses.     Heart sounds: Normal heart sounds. No murmur heard. Pulmonary:     Effort: Pulmonary effort is normal. No respiratory distress.     Breath sounds: Normal breath sounds. No wheezing or rhonchi.  Abdominal:     General: There is no distension.     Palpations: Abdomen is soft.     Tenderness: There is no abdominal tenderness.  Musculoskeletal:     Cervical back: Neck supple.     Right lower leg: No edema.     Left lower leg: No edema.  Lymphadenopathy:     Cervical: No cervical adenopathy.  Skin:    General: Skin is warm and dry.     Capillary Refill: Capillary refill takes less than 2 seconds.     Findings: No rash.  Neurological:     Mental Status: He is alert and oriented to person, place, and time.     Cranial Nerves: No cranial nerve deficit.  Psychiatric:        Mood and Affect: Mood normal.        Behavior: Behavior normal.      No results found for any visits on 05/03/21.  Assessment & Plan     Problem List Items Addressed This Visit       Cardiovascular and Mediastinum   Essential hypertension - Primary    Chronic and uncontrolled Was previously well controlled on losartan-HCTZ, but this was decreased to just chlorthalidone 25 mg daily after his blood pressure improved with weight loss It is now elevated again, so we will continue chlorthalidone 25 mg daily and add losartan 50 mg daily Consider combo pill again in the future Recheck metabolic panel in 1 to 2 weeks after starting losartan      Relevant Medications   losartan (COZAAR) 50 MG tablet   Other Relevant Orders   Basic Metabolic Panel (BMET)     Other   Obesity    F/b [facility] On mounjaro Discussed importance of healthy weight management Discussed diet and exercise       Pre-diabetes    Chronic and stable On Menetrier as above Recheck A1c Encourage low-carb diet      Relevant Orders   Hemoglobin A1c   Right-sided nosebleed    New  problem Seems to be anterior nosebleed Discussed importance of moisturizing the area, avoiding picking it, and using humidifier in his room while sleeping      Insomnia    New problem Decrease dose of melatonin to about 3 mg nightly Discussed sleep hygiene        Return in about 4 weeks (around 05/31/2021) for BP f/u.      I, [name], MD,  have reviewed all documentation for this visit. The documentation on 05/03/21 for the exam, diagnosis, procedures, and orders are all accurate and complete.   Amiri Riechers, Dionne Bucy, MD, MPH [facility]

## 2021-05-03 NOTE — Assessment & Plan Note (Signed)
Chronic and uncontrolled Was previously well controlled on losartan-HCTZ, but this was decreased to just chlorthalidone 25 mg daily after his blood pressure improved with weight loss It is now elevated again, so we will continue chlorthalidone 25 mg daily and add losartan 50 mg daily Consider combo pill again in the future Recheck metabolic panel in 1 to 2 weeks after starting losartan

## 2021-05-06 ENCOUNTER — Encounter (INDEPENDENT_AMBULATORY_CARE_PROVIDER_SITE_OTHER): Payer: Self-pay | Admitting: Family Medicine

## 2021-05-06 ENCOUNTER — Other Ambulatory Visit: Payer: Self-pay

## 2021-05-06 ENCOUNTER — Ambulatory Visit (INDEPENDENT_AMBULATORY_CARE_PROVIDER_SITE_OTHER): Payer: BC Managed Care – PPO | Admitting: Family Medicine

## 2021-05-06 VITALS — BP 132/85 | HR 97 | Temp 98.3°F | Ht 70.0 in | Wt 229.0 lb

## 2021-05-06 DIAGNOSIS — I10 Essential (primary) hypertension: Secondary | ICD-10-CM | POA: Diagnosis not present

## 2021-05-06 DIAGNOSIS — Z6832 Body mass index (BMI) 32.0-32.9, adult: Secondary | ICD-10-CM | POA: Diagnosis not present

## 2021-05-06 DIAGNOSIS — R7303 Prediabetes: Secondary | ICD-10-CM

## 2021-05-06 DIAGNOSIS — Z6839 Body mass index (BMI) 39.0-39.9, adult: Secondary | ICD-10-CM

## 2021-05-06 DIAGNOSIS — E669 Obesity, unspecified: Secondary | ICD-10-CM | POA: Diagnosis not present

## 2021-05-06 MED ORDER — CHLORTHALIDONE 25 MG PO TABS
12.5000 mg | ORAL_TABLET | Freq: Every day | ORAL | 0 refills | Status: DC
  Filled 2021-05-06: qty 45, 90d supply, fill #0

## 2021-05-06 MED ORDER — TIRZEPATIDE 5 MG/0.5ML ~~LOC~~ SOAJ
5.0000 mg | SUBCUTANEOUS | 0 refills | Status: DC
  Filled 2021-05-06: qty 6, 84d supply, fill #0
  Filled 2021-05-07: qty 2, 28d supply, fill #0

## 2021-05-06 NOTE — Progress Notes (Signed)
Chief Complaint:   OBESITY Sean Kennedy is here to discuss his progress with his obesity treatment plan along with follow-up of his obesity related diagnoses. Sean Kennedy is on keeping a food journal and adhering to recommended goals of 1450-1600 calories and 120 grams protein and states he is following his eating plan approximately 80% of the time. Sean Kennedy states he is not currently exercising.  Today's visit was #: 16 Starting weight: 267 lbs Starting date: 04/20/2020 Today's weight: 229 lbs Today's date: 05/06/2021 Total lbs lost to date: 38 Total lbs lost since last in-office visit: 0  Interim History: [place] stayed local for the [place]. Food wise, he is ending up at ~80% on plan. The last time he was here, Sean Kennedy was increased to 7.5 and he developed significant GI side effects- sour, sulphur burps, gas, diarrhea. Breakfast in the morning- chicken, egg, and cheese; Lunch is leftovers of chicken, broccoli, rice; Supper- spaghetti. Pt just started a new job working with [employer].  Subjective:   1. Essential hypertension BP improved today. Pt denies chest pain/chest pressure/headache.  2. Prediabetes Pt had significant GI side effects on 7.5 mg Mounjaro. Her last A1c was 5.7 with an insulin level of 10.3.  Assessment/Plan:   1. Essential hypertension Sean Kennedy is working on healthy weight loss and exercise to improve blood pressure control. We will watch for signs of hypotension as he continues his lifestyle modifications.  Refill- chlorthalidone (HYGROTON) 25 MG tablet; Take 1/2 tablets (12.5 mg total) by mouth daily.  Dispense: 45 tablet; Refill: 0  2. Prediabetes Sean Kennedy will decrease Mounjaro to 5 mg and continue to work on weight loss, exercise, and decreasing simple carbohydrates to help decrease the risk of diabetes.   Refill and Decrease- tirzepatide (MOUNJARO) 5 MG/0.5ML Pen; Inject 5 mg into the skin once a week.  Dispense: 6 mL; Refill: 0  3. Obesity with current BMI of  32.9  Sean Kennedy is currently in the action stage of change. As such, his goal is to continue with weight loss efforts. He has agreed to keeping a food journal and adhering to recommended goals of 1450-1600 calories and 100+ grams protein.   Exercise goals: All adults should avoid inactivity. Some physical activity is better than none, and adults who participate in any amount of physical activity gain some health benefits. Pt is to get a gym membership.  Behavioral modification strategies: increasing lean protein intake, meal planning and cooking strategies, planning for success, and keeping a strict food journal.  Sean Kennedy has agreed to follow-up with our clinic in 3-4 weeks, fasting. He was informed of the importance of frequent follow-up visits to maximize his success with intensive lifestyle modifications for his multiple health conditions.   Objective:   Blood pressure 132/85, pulse 97, temperature 98.3 F (36.8 C), height 5\' 10"  (1.778 m), weight 229 lb (103.9 kg), SpO2 98 %. Body mass index is 32.86 kg/m.  General: Cooperative, alert, well developed, in no acute distress. HEENT: Conjunctivae and lids unremarkable. Cardiovascular: Regular rhythm.  Lungs: Normal work of breathing. Neurologic: No focal deficits.   Lab Results  Component Value Date   CREATININE 1.30 (H) 12/09/2020   BUN 14 12/09/2020   NA 140 12/09/2020   K 4.3 12/09/2020   CL 100 12/09/2020   CO2 22 12/09/2020   Lab Results  Component Value Date   ALT 32 12/09/2020   AST 33 12/09/2020   ALKPHOS 71 12/09/2020   BILITOT 0.3 12/09/2020   Lab Results  Component Value Date   HGBA1C 5.7 (H) 12/09/2020   HGBA1C 5.8 (H) 04/20/2020   Lab Results  Component Value Date   INSULIN 10.3 04/20/2020   Lab Results  Component Value Date   TSH 1.550 04/20/2020   Lab Results  Component Value Date   CHOL 169 12/09/2020   HDL 45 12/09/2020   LDLCALC 110 (H) 12/09/2020   TRIG 76 12/09/2020   CHOLHDL 3.8 12/09/2020    Lab Results  Component Value Date   VD25OH 43.3 12/09/2020   VD25OH 20.1 (L) 04/20/2020   Lab Results  Component Value Date   WBC 3.7 04/20/2020   HGB 15.2 04/20/2020   HCT 45.3 04/20/2020   MCV 80 04/20/2020   PLT 236 04/20/2020    Attestation Statements:   Reviewed by clinician on day of visit: allergies, medications, problem list, medical history, surgical history, family history, social history, and previous encounter notes.  Edmund Hilda, CMA, am acting as transcriptionist for Reuben Likes, MD.  I have reviewed the above documentation for accuracy and completeness, and I agree with the above. - Reuben Likes, MD

## 2021-05-07 ENCOUNTER — Other Ambulatory Visit: Payer: Self-pay

## 2021-05-11 ENCOUNTER — Other Ambulatory Visit: Payer: Self-pay

## 2021-05-11 ENCOUNTER — Encounter (INDEPENDENT_AMBULATORY_CARE_PROVIDER_SITE_OTHER): Payer: Self-pay

## 2021-06-03 NOTE — Progress Notes (Signed)
Established patient visit   Patient: Sean Kennedy   DOB: 1980-05-17   41 y.o. Male  MRN: [id number] Visit Date: 06/04/2021  Today's healthcare provider: [name], MD   Chief Complaint  Patient presents with   Hypertension   Subjective    HPI  Hypertension, follow-up  BP Readings from Last 3 Encounters:  06/04/21 (!) 132/93  05/06/21 132/85  05/03/21 (!) 150/103   Wt Readings from Last 3 Encounters:  06/04/21 239 lb (108.4 kg)  05/06/21 229 lb (103.9 kg)  05/03/21 235 lb 6.4 oz (106.8 kg)     He was last seen for hypertension 1 months ago.  BP at that visit was as above. Management since that visit includes discontinued Chlorthalidone and added Losartan 50 mg daily.  He reports good compliance with treatment. He is not having side effects.  He is following a Regular diet. He is not exercising. He does smoke.  Use of agents associated with hypertension: none.   Outside blood pressures are 130's over 80's . Symptoms: No chest pain No chest pressure  No palpitations No syncope  No dyspnea No orthopnea  No paroxysmal nocturnal dyspnea No lower extremity edema   Pertinent labs: Lab Results  Component Value Date   CHOL 169 12/09/2020   HDL 45 12/09/2020   LDLCALC 110 (H) 12/09/2020   TRIG 76 12/09/2020   CHOLHDL 3.8 12/09/2020   Lab Results  Component Value Date   NA 140 12/09/2020   K 4.3 12/09/2020   CREATININE 1.30 (H) 12/09/2020   EGFR 72 12/09/2020   GLUCOSE 83 12/09/2020   TSH 1.550 04/20/2020     The 10-year ASCVD risk score (Arnett DK, et al., 2019) is: 5.4%   ---------------------------------------------------------------------------------------------------   Medications: Outpatient Medications Prior to Visit  Medication Sig   tirzepatide (MOUNJARO) 5 MG/0.5ML Pen Inject 5 mg into the skin once a week.   [DISCONTINUED] chlorthalidone (HYGROTON) 25 MG tablet Take 1/2 tablets (12.5 mg total) by mouth daily.   [DISCONTINUED]  losartan (COZAAR) 50 MG tablet Take 1 tablet (50 mg total) by mouth daily.   No facility-administered medications prior to visit.    Review of Systems per HPI      Objective    BP (!) 132/93 (BP Location: Left Arm, Patient Position: Sitting, Cuff Size: Large)    Pulse 93    Temp 98.7 F (37.1 C) (Oral)    Wt 239 lb (108.4 kg)    SpO2 98%    BMI 34.29 kg/m     Physical Exam Vitals reviewed.  Constitutional:      General: He is not in acute distress.    Appearance: Normal appearance. He is not diaphoretic.  HENT:     Head: Normocephalic and atraumatic.  Eyes:     General: No scleral icterus.    Conjunctiva/sclera: Conjunctivae normal.  Cardiovascular:     Rate and Rhythm: Normal rate and regular rhythm.     Pulses: Normal pulses.     Heart sounds: Normal heart sounds. No murmur heard. Pulmonary:     Effort: Pulmonary effort is normal. No respiratory distress.     Breath sounds: Normal breath sounds. No wheezing or rhonchi.  Musculoskeletal:     Cervical back: Neck supple.     Right lower leg: No edema.     Left lower leg: No edema.  Lymphadenopathy:     Cervical: No cervical adenopathy.  Skin:    General: Skin is warm and dry.  Capillary Refill: Capillary refill takes less than 2 seconds.     Findings: No rash.  Neurological:     Mental Status: He is alert and oriented to person, place, and time.  Psychiatric:        Mood and Affect: Mood normal.        Behavior: Behavior normal.      No results found for any visits on 06/04/21.  Assessment & Plan     Problem List Items Addressed This Visit       Cardiovascular and Mediastinum   Essential hypertension - Primary    Improving but remains slightly elevated Continue chlorthalidone 52m daily Increase losatran to 100 mg daily Upcoming labs F/u in 2-3 months      Relevant Medications   losartan (COZAAR) 100 MG tablet   chlorthalidone (HYGROTON) 25 MG tablet     Other   Obesity    Continues to work  with [employer] Continue mounjaro      Pre-diabetes    Recommend low carb diet Recheck A1c (upcoming labs)      Other Visit Diagnoses     Toenail deformity       Relevant Orders   Ambulatory referral to Podiatry        Return in about 2 months (around 08/02/2021) for CPE.      IArgentina PonderDeSanto,acting as a scribe for ALavon Paganini MD.,have documented all relevant documentation on the behalf of ALavon Paganini MD,as directed by  ALavon Paganini MD while in the presence of ALavon Paganini MD.  I, ALavon Paganini MD, have reviewed all documentation for this visit. The documentation on 06/04/21 for the exam, diagnosis, procedures, and orders are all accurate and complete.   Levita Monical, ADionne Bucy MD, MPH [facility]

## 2021-06-04 ENCOUNTER — Encounter: Payer: Self-pay | Admitting: Family Medicine

## 2021-06-04 ENCOUNTER — Ambulatory Visit (INDEPENDENT_AMBULATORY_CARE_PROVIDER_SITE_OTHER): Payer: BC Managed Care – PPO | Admitting: Family Medicine

## 2021-06-04 ENCOUNTER — Other Ambulatory Visit: Payer: Self-pay

## 2021-06-04 VITALS — BP 132/93 | HR 93 | Temp 98.7°F | Wt 239.0 lb

## 2021-06-04 DIAGNOSIS — L608 Other nail disorders: Secondary | ICD-10-CM

## 2021-06-04 DIAGNOSIS — R7303 Prediabetes: Secondary | ICD-10-CM

## 2021-06-04 DIAGNOSIS — Z6839 Body mass index (BMI) 39.0-39.9, adult: Secondary | ICD-10-CM

## 2021-06-04 DIAGNOSIS — I10 Essential (primary) hypertension: Secondary | ICD-10-CM

## 2021-06-04 MED ORDER — LOSARTAN POTASSIUM 100 MG PO TABS: 100.0000 mg | ORAL_TABLET | Freq: Every day | ORAL | 1 refills | Status: DC

## 2021-06-04 MED ORDER — CHLORTHALIDONE 25 MG PO TABS: 25.0000 mg | ORAL_TABLET | Freq: Every day | ORAL | 0 refills | Status: DC

## 2021-06-04 NOTE — Assessment & Plan Note (Signed)
Improving but remains slightly elevated Continue chlorthalidone 25mg  daily Increase losatran to 100 mg daily Upcoming labs F/u in 2-3 months

## 2021-06-04 NOTE — Assessment & Plan Note (Signed)
Recommend low carb diet Recheck A1c (upcoming labs)

## 2021-06-04 NOTE — Assessment & Plan Note (Signed)
Continues to work with [employer] Continue [employer]

## 2021-06-07 ENCOUNTER — Ambulatory Visit (INDEPENDENT_AMBULATORY_CARE_PROVIDER_SITE_OTHER): Payer: BC Managed Care – PPO | Admitting: Family Medicine

## 2021-06-07 ENCOUNTER — Ambulatory Visit (INDEPENDENT_AMBULATORY_CARE_PROVIDER_SITE_OTHER): Payer: BC Managed Care – PPO | Admitting: Podiatry

## 2021-06-07 ENCOUNTER — Encounter: Payer: Self-pay | Admitting: Podiatry

## 2021-06-07 ENCOUNTER — Other Ambulatory Visit: Payer: Self-pay

## 2021-06-07 ENCOUNTER — Ambulatory Visit (INDEPENDENT_AMBULATORY_CARE_PROVIDER_SITE_OTHER): Payer: BC Managed Care – PPO | Admitting: Bariatrics

## 2021-06-07 ENCOUNTER — Encounter (INDEPENDENT_AMBULATORY_CARE_PROVIDER_SITE_OTHER): Payer: Self-pay | Admitting: Bariatrics

## 2021-06-07 VITALS — BP 120/80 | HR 80 | Temp 98.1°F | Ht 70.0 in | Wt 235.0 lb

## 2021-06-07 DIAGNOSIS — B351 Tinea unguium: Secondary | ICD-10-CM

## 2021-06-07 DIAGNOSIS — E669 Obesity, unspecified: Secondary | ICD-10-CM

## 2021-06-07 DIAGNOSIS — E559 Vitamin D deficiency, unspecified: Secondary | ICD-10-CM

## 2021-06-07 DIAGNOSIS — B353 Tinea pedis: Secondary | ICD-10-CM | POA: Diagnosis not present

## 2021-06-07 DIAGNOSIS — R7303 Prediabetes: Secondary | ICD-10-CM | POA: Diagnosis not present

## 2021-06-07 DIAGNOSIS — I10 Essential (primary) hypertension: Secondary | ICD-10-CM | POA: Diagnosis not present

## 2021-06-07 DIAGNOSIS — E7800 Pure hypercholesterolemia, unspecified: Secondary | ICD-10-CM | POA: Insufficient documentation

## 2021-06-07 DIAGNOSIS — Z6833 Body mass index (BMI) 33.0-33.9, adult: Secondary | ICD-10-CM

## 2021-06-07 MED ORDER — TERBINAFINE HCL 250 MG PO TABS: 250.0000 mg | ORAL_TABLET | Freq: Every day | ORAL | 0 refills | Status: AC

## 2021-06-07 MED ORDER — TIRZEPATIDE 5 MG/0.5ML ~~LOC~~ SOAJ
5.0000 mg | SUBCUTANEOUS | 0 refills | Status: DC
  Filled 2021-06-07: qty 2, 28d supply, fill #0

## 2021-06-07 MED ORDER — KETOCONAZOLE 2 % EX CREA: 1.0000 "application " | TOPICAL_CREAM | Freq: Every day | CUTANEOUS | 2 refills | Status: DC

## 2021-06-07 NOTE — Progress Notes (Signed)
°  Subjective:  Patient ID: Sean Kennedy, male    DOB: October 16, 1980,  MRN: [id number]  Chief Complaint  Patient presents with   Nail Problem   Tinea Pedis    Patient presents today for nail fungus most toes bilat feet and dry cracked skin that sometimes itches between toes.  He says he has used OTC Lamisil nail lacquer with no results    41 y.o. male presents with the above complaint. History confirmed with patient.   Objective:  Physical Exam: warm, good capillary refill, no trophic changes or ulcerative lesions, normal DP and PT pulses, normal sensory exam, onychomycosis, and tinea pedis.    Assessment:   1. Onychomycosis   2. Tinea pedis of both feet      Plan:  Patient was evaluated and treated and all questions answered.  Discussed the etiology and treatment options for tinea pedis and onychomycosis.  Discussed topical and oral treatment.  Recommended topical treatment with 2% ketoconazole cream for the tinea pedis as well as terbinafine 90-day course for the onychomycosis.  This was sent to the patient's pharmacy.  Also discussed appropriate foot hygiene, use of antifungal spray such as Tinactin in shoes, as well as cleaning foot surfaces such as showers and bathroom floors with bleach.   Return in about 4 months (around 10/05/2021) for follow up after nail fungus treatment.

## 2021-06-07 NOTE — Progress Notes (Signed)
Chief Complaint:   OBESITY Sean Kennedy is here to discuss his progress with his obesity treatment plan along with follow-up of his obesity related diagnoses. Sean Kennedy is on keeping a food journal and adhering to recommended goals of 1450-1600 calories and 100+ grams of protein daily and states he is following his eating plan approximately 60% of the time. Sean Kennedy states he is doing 0 minutes 0 times per week.  Today's visit was #: 17 Starting weight: 267 lbs Starting date: 04/20/2020 Today's weight: 235 lbs Today's date: 06/07/2021 Total lbs lost to date: 32 Total lbs lost since last in-office visit: 0  Interim History: Sean Kennedy is up 6 lbs since his last visit. His Mounjaro was increased to 7.5 mg in the past, but he could not tolerate it secondary to GI issues.   Subjective:   1. Pre-diabetes Sean Kennedy is taking Mounjaro at 5 mg weekly.  2. Essential hypertension Sean Kennedy's blood pressure is controlled.   3. Vitamin D deficiency Sean Kennedy is not on Vitamin D at this time.  Assessment/Plan:   1. Pre-diabetes We will check labs today, and we will refill Mounjaro 5 mg for 1 month. Sean Kennedy will continue to work on weight loss, exercise, and decreasing simple carbohydrates to help decrease the risk of diabetes.   - tirzepatide [facility]) 5 MG/0.5ML Pen; Inject 5 mg into the skin once a week.  Dispense: 2 mL; Refill: 0 - Lipid Panel With LDL/HDL Ratio - Insulin, random - Hemoglobin A1c - Comprehensive metabolic panel  2. Essential hypertension Sean Kennedy will continue his medications, and will continue working on healthy weight loss and exercise to improve blood pressure control. We will watch for signs of hypotension as he continues his lifestyle modifications.  3. Vitamin D deficiency Low Vitamin D level contributes to fatigue and are associated with obesity, breast, and colon cancer. We will check labs today. Sean Kennedy will follow-up for routine testing of Vitamin D, at least 2-3 times per year to avoid  over-replacement.  - VITAMIN D 25 Hydroxy (Vit-D Deficiency, Fractures)  4. Obesity BMI today is 33.7 Sean Kennedy is currently in the action stage of change. As such, his goal is to continue with weight loss efforts. He has agreed to keeping a food journal and adhering to recommended goals of 1450-1600 calories and 120 grams of protein daily.   Meal planning, will adhere closely to the plan 80-90%.   Exercise goals: Will join the gym.  Behavioral modification strategies: increasing lean protein intake, decreasing simple carbohydrates, increasing vegetables, increasing water intake, decreasing eating out, no skipping meals, meal planning and cooking strategies, keeping healthy foods in the home, and planning for success.  Sean Kennedy has agreed to follow-up with our clinic in 3 weeks with Dr. Lawson Radar. He was informed of the importance of frequent follow-up visits to maximize his success with intensive lifestyle modifications for his multiple health conditions.   Sean Kennedy was informed we would discuss his lab results at his next visit unless there is a critical issue that needs to be addressed sooner. Sean Kennedy agreed to keep his next visit at the agreed upon time to discuss these results.  Objective:   Blood pressure 120/80, pulse 80, temperature 98.1 F (36.7 C), height 5\' 10"  (1.778 m), weight 235 lb (106.6 kg), SpO2 99 %. Body mass index is 33.72 kg/m.  General: Cooperative, alert, well developed, in no acute distress. HEENT: Conjunctivae and lids unremarkable. Cardiovascular: Regular rhythm.  Lungs: Normal work of breathing. Neurologic: No focal deficits.   Lab Results  Component Value Date   CREATININE 1.30 (H) 12/09/2020   BUN 14 12/09/2020   NA 140 12/09/2020   K 4.3 12/09/2020   CL 100 12/09/2020   CO2 22 12/09/2020   Lab Results  Component Value Date   ALT 32 12/09/2020   AST 33 12/09/2020   ALKPHOS 71 12/09/2020   BILITOT 0.3 12/09/2020   Lab Results  Component Value Date    HGBA1C 5.7 (H) 12/09/2020   HGBA1C 5.8 (H) 04/20/2020   Lab Results  Component Value Date   INSULIN 10.3 04/20/2020   Lab Results  Component Value Date   TSH 1.550 04/20/2020   Lab Results  Component Value Date   CHOL 169 12/09/2020   HDL 45 12/09/2020   LDLCALC 110 (H) 12/09/2020   TRIG 76 12/09/2020   CHOLHDL 3.8 12/09/2020   Lab Results  Component Value Date   VD25OH 43.3 12/09/2020   VD25OH 20.1 (L) 04/20/2020   Lab Results  Component Value Date   WBC 3.7 04/20/2020   HGB 15.2 04/20/2020   HCT 45.3 04/20/2020   MCV 80 04/20/2020   PLT 236 04/20/2020   No results found for: IRON, TIBC, FERRITIN  Attestation Statements:   Reviewed by clinician on day of visit: allergies, medications, problem list, medical history, surgical history, family history, social history, and previous encounter notes.   Trude Mcburney, am acting as Energy manager for [employer], DO.  I have reviewed the above documentation for accuracy and completeness, and I agree with the above. Corinna Capra, DO

## 2021-06-08 ENCOUNTER — Ambulatory Visit: Payer: Commercial Managed Care - PPO | Admitting: Family Medicine

## 2021-06-08 LAB — COMPREHENSIVE METABOLIC PANEL
ALT: 41 IU/L (ref 0–44)
AST: 29 IU/L (ref 0–40)
Albumin/Globulin Ratio: 1.7 (ref 1.2–2.2)
Albumin: 4.4 g/dL (ref 4.0–5.0)
Alkaline Phosphatase: 79 IU/L (ref 44–121)
BUN/Creatinine Ratio: 12 (ref 9–20)
BUN: 15 mg/dL (ref 6–24)
Bilirubin Total: 0.3 mg/dL (ref 0.0–1.2)
CO2: 26 mmol/L (ref 20–29)
Calcium: 9.1 mg/dL (ref 8.7–10.2)
Chloride: 104 mmol/L (ref 96–106)
Creatinine, Ser: 1.27 mg/dL (ref 0.76–1.27)
Globulin, Total: 2.6 g/dL (ref 1.5–4.5)
Glucose: 87 mg/dL (ref 70–99)
Potassium: 4.5 mmol/L (ref 3.5–5.2)
Sodium: 143 mmol/L (ref 134–144)
Total Protein: 7 g/dL (ref 6.0–8.5)
eGFR: 73 mL/min/{1.73_m2} (ref >59)

## 2021-06-08 LAB — LIPID PANEL WITH LDL/HDL RATIO
Cholesterol, Total: 149 mg/dL (ref 100–199)
HDL: 43 mg/dL (ref >39)
LDL Chol Calc (NIH): 98 mg/dL (ref 0–99)
LDL/HDL Ratio: 2.3 ratio (ref 0.0–3.6)
Triglycerides: 34 mg/dL (ref 0–149)
VLDL Cholesterol Cal: 8 mg/dL (ref 5–40)

## 2021-06-08 LAB — INSULIN, RANDOM: INSULIN: 8.4 u[IU]/mL (ref 2.6–24.9)

## 2021-06-08 LAB — HEMOGLOBIN A1C
Est. average glucose Bld gHb Est-mCnc: 114 mg/dL
Hgb A1c MFr Bld: 5.6 % (ref 4.8–5.6)

## 2021-06-08 LAB — VITAMIN D 25 HYDROXY (VIT D DEFICIENCY, FRACTURES): Vit D, 25-Hydroxy: 53.1 ng/mL (ref 30.0–100.0)

## 2021-06-29 ENCOUNTER — Encounter (INDEPENDENT_AMBULATORY_CARE_PROVIDER_SITE_OTHER): Payer: Self-pay | Admitting: Family Medicine

## 2021-06-29 ENCOUNTER — Other Ambulatory Visit: Payer: Self-pay

## 2021-06-29 ENCOUNTER — Ambulatory Visit (INDEPENDENT_AMBULATORY_CARE_PROVIDER_SITE_OTHER): Payer: BC Managed Care – PPO | Admitting: Family Medicine

## 2021-06-29 VITALS — BP 125/86 | HR 85 | Temp 98.3°F | Ht 70.0 in | Wt 233.0 lb

## 2021-06-29 DIAGNOSIS — Z6833 Body mass index (BMI) 33.0-33.9, adult: Secondary | ICD-10-CM

## 2021-06-29 DIAGNOSIS — R7303 Prediabetes: Secondary | ICD-10-CM

## 2021-06-29 DIAGNOSIS — E669 Obesity, unspecified: Secondary | ICD-10-CM | POA: Diagnosis not present

## 2021-06-29 DIAGNOSIS — Z9189 Other specified personal risk factors, not elsewhere classified: Secondary | ICD-10-CM

## 2021-06-29 DIAGNOSIS — I10 Essential (primary) hypertension: Secondary | ICD-10-CM | POA: Diagnosis not present

## 2021-06-29 MED ORDER — TIRZEPATIDE 5 MG/0.5ML ~~LOC~~ SOAJ
5.0000 mg | SUBCUTANEOUS | 0 refills | Status: DC
  Filled 2021-06-29 – 2021-07-02 (×2): qty 2, 28d supply, fill #0

## 2021-07-01 NOTE — Progress Notes (Signed)
? ? ? ?Chief Complaint:  ? ?OBESITY ?Sean Kennedy is here to discuss his progress with his obesity treatment plan along with follow-up of his obesity related diagnoses. Sean Kennedy is on keeping a food journal and adhering to recommended goals of 1450-1600 calories and 120 grams of protein daily and states he is following his eating plan approximately 60% of the time. Sean Kennedy states he is doing 0 minutes 0 times per week. ? ?Today's visit was #: 18 ?Starting weight: 267 lbs ?Starting date: 04/20/2020 ?Today's weight: 233 lbs ?Today's date: 06/29/2021 ?Total lbs lost to date: 20 ?Total lbs lost since last in-office visit: 2 ? ?Interim History: Sean Kennedy is feeling an increase in drive to snack in the last few weeks. He is feeling more drawn to carbs. He is going to [place] tomorrow for an [employer]. He wants to start exercising, but he hasn't joined the gym yet. He is noticing that he cant drink soft drinks without experiencing GI upset. ? ?Subjective:  ? ?1. Essential hypertension ?Sean Kennedy's blood pressure is well controlled. He denies chest pain, chest pressure, or headache. He is on chlorthalidone and Cozaar. ? ?2. Pre-diabetes ?Sean Kennedy is doing well on Mounjaro. He notes occasional GI side effects with carbonated beverages. ? ?3. At risk for activity intolerance ?Sean Kennedy is at risk for exercise intolerance due to lack of activity. ? ?Assessment/Plan:  ? ?1. Essential hypertension ?Sean Kennedy will continue his medications, no change in dose.  ? ?2. Pre-diabetes ?Sean Kennedy will continue Mounjaro 5 mg SubQ weekly, and we will refill for 1 month. ? ?- tirzepatide (MOUNJARO) 5 MG/0.5ML Pen; Inject 5 mg into the skin once a week.  Dispense: 2 mL; Refill: 0 ? ?3. At risk for activity intolerance ?Sean Kennedy was given approximately 15 minutes of exercise intolerance counseling today. He is 41 y.o. male and has risk factors exercise intolerance including obesity. We discussed intensive lifestyle modifications today with an emphasis on specific weight loss  instructions and strategies. Sean Kennedy will slowly increase activity as tolerated. ? ?Repetitive spaced learning was employed today to elicit superior memory formation and behavioral change. ? ?4. Obesity with current BMI of 33.5 ?Sean Kennedy is currently in the action stage of change. As such, his goal is to continue with weight loss efforts. He has agreed to keeping a food journal and adhering to recommended goals of 1650-1800 calories and 125+ grams of protein daily.  ? ?Exercise goals: All adults should avoid inactivity. Some physical activity is better than none, and adults who participate in any amount of physical activity gain some health benefits. ? ?Behavioral modification strategies: increasing lean protein intake, meal planning and cooking strategies, keeping healthy foods in the home, and planning for success. ? ?Sean Kennedy has agreed to follow-up with our clinic in 3 weeks. He was informed of the importance of frequent follow-up visits to maximize his success with intensive lifestyle modifications for his multiple health conditions.  ? ?Objective:  ? ?Blood pressure 125/86, pulse 85, temperature 98.3 ?F (36.8 ?C), height 5\' 10"  (1.778 m), weight 233 lb (105.7 kg), SpO2 100 %. ?Body mass index is 33.43 kg/m?. ? ?General: Cooperative, alert, well developed, in no acute distress. ?HEENT: Conjunctivae and lids unremarkable. ?Cardiovascular: Regular rhythm.  ?Lungs: Normal work of breathing. ?Neurologic: No focal deficits.  ? ?Lab Results  ?Component Value Date  ? CREATININE 1.27 06/07/2021  ? BUN 15 06/07/2021  ? NA 143 06/07/2021  ? K 4.5 06/07/2021  ? CL 104 06/07/2021  ? CO2 26 06/07/2021  ? ?Lab Results  ?  Component Value Date  ? ALT 41 06/07/2021  ? AST 29 06/07/2021  ? ALKPHOS 79 06/07/2021  ? BILITOT 0.3 06/07/2021  ? ?Lab Results  ?Component Value Date  ? HGBA1C 5.6 06/07/2021  ? HGBA1C 5.7 (H) 12/09/2020  ? HGBA1C 5.8 (H) 04/20/2020  ? ?Lab Results  ?Component Value Date  ? INSULIN 8.4 06/07/2021  ? INSULIN 10.3  04/20/2020  ? ?Lab Results  ?Component Value Date  ? TSH 1.550 04/20/2020  ? ?Lab Results  ?Component Value Date  ? CHOL 149 06/07/2021  ? HDL 43 06/07/2021  ? Colbert 98 06/07/2021  ? TRIG 34 06/07/2021  ? CHOLHDL 3.8 12/09/2020  ? ?Lab Results  ?Component Value Date  ? VD25OH 53.1 06/07/2021  ? VD25OH 43.3 12/09/2020  ? VD25OH 20.1 (L) 04/20/2020  ? ?Lab Results  ?Component Value Date  ? WBC 3.7 04/20/2020  ? HGB 15.2 04/20/2020  ? HCT 45.3 04/20/2020  ? MCV 80 04/20/2020  ? PLT 236 04/20/2020  ? ?No results found for: IRON, TIBC, FERRITIN ? ?Attestation Statements:  ? ?Reviewed by clinician on day of visit: allergies, medications, problem list, medical history, surgical history, family history, social history, and previous encounter notes. ? ? ?I, Trixie Dredge, am acting as transcriptionist for Coralie Common, MD. ? ?I have reviewed the above documentation for accuracy and completeness, and I agree with the above. Coralie Common, MD ? ? ?

## 2021-07-02 ENCOUNTER — Other Ambulatory Visit: Payer: Self-pay

## 2021-07-05 ENCOUNTER — Telehealth (INDEPENDENT_AMBULATORY_CARE_PROVIDER_SITE_OTHER): Payer: Self-pay | Admitting: Family Medicine

## 2021-07-05 ENCOUNTER — Encounter (INDEPENDENT_AMBULATORY_CARE_PROVIDER_SITE_OTHER): Payer: Self-pay

## 2021-07-05 NOTE — Telephone Encounter (Signed)
Prior authorization denied for Mounjaro. Patient already uses copay card. Patient sent denial message via mychart. ?

## 2021-07-09 ENCOUNTER — Other Ambulatory Visit: Payer: Self-pay

## 2021-07-26 ENCOUNTER — Ambulatory Visit (INDEPENDENT_AMBULATORY_CARE_PROVIDER_SITE_OTHER): Payer: BC Managed Care – PPO | Admitting: Family Medicine

## 2021-08-03 ENCOUNTER — Encounter (INDEPENDENT_AMBULATORY_CARE_PROVIDER_SITE_OTHER): Payer: Self-pay | Admitting: Adult Health

## 2021-08-03 ENCOUNTER — Ambulatory Visit (INDEPENDENT_AMBULATORY_CARE_PROVIDER_SITE_OTHER): Payer: BC Managed Care – PPO | Admitting: Adult Health

## 2021-08-03 ENCOUNTER — Other Ambulatory Visit: Payer: Self-pay

## 2021-08-03 VITALS — BP 130/88 | HR 87 | Temp 97.9°F | Ht 70.0 in | Wt 236.0 lb

## 2021-08-03 DIAGNOSIS — E669 Obesity, unspecified: Secondary | ICD-10-CM | POA: Diagnosis not present

## 2021-08-03 DIAGNOSIS — I10 Essential (primary) hypertension: Secondary | ICD-10-CM

## 2021-08-03 DIAGNOSIS — Z6834 Body mass index (BMI) 34.0-34.9, adult: Secondary | ICD-10-CM | POA: Diagnosis not present

## 2021-08-03 DIAGNOSIS — R7303 Prediabetes: Secondary | ICD-10-CM

## 2021-08-03 DIAGNOSIS — Z9189 Other specified personal risk factors, not elsewhere classified: Secondary | ICD-10-CM

## 2021-08-03 MED ORDER — TIRZEPATIDE 7.5 MG/0.5ML ~~LOC~~ SOAJ
7.5000 mg | SUBCUTANEOUS | 0 refills | Status: DC
  Filled 2021-08-03 – 2021-08-23 (×3): qty 2, 28d supply, fill #0

## 2021-08-13 ENCOUNTER — Other Ambulatory Visit: Payer: Self-pay

## 2021-08-13 ENCOUNTER — Encounter (INDEPENDENT_AMBULATORY_CARE_PROVIDER_SITE_OTHER): Payer: Self-pay | Admitting: Adult Health

## 2021-08-13 ENCOUNTER — Encounter: Payer: Self-pay | Admitting: Family Medicine

## 2021-08-16 NOTE — Progress Notes (Signed)
? ? ? ?Chief Complaint:  ? ?OBESITY ?Sean Kennedy is here to discuss his progress with his obesity treatment plan along with follow-up of his obesity related diagnoses. Sean Kennedy is on keeping a food journal and adhering to recommended goals of 1650-1800 calories and 125+ grams of protein daily and states he is following his eating plan approximately 50% of the time. Sean Kennedy states he is doing 0 minutes 0 times per week. ? ?Today's visit was #: 19 ?Starting weight: 267 lbs ?Starting date: 04/20/2020 ?Today's weight: 236 lbs ?Today's date: 08/03/2021 ?Total lbs lost to date: 65 ?Total lbs lost since last in-office visit: 0 ? ?Interim History:  ?On 11/12/2020, he was started on Mounjaro 2.5 mg. On 12/09/2020, Sean Kennedy was increased to 5 mg.  ?On 04/08/2021, Sean Kennedy was increased to 7.5 mg=GI upset, so decreased Mounjaro to 5 mg on 05/06/2021. ? ?OF note: previously on Topamx and phentermine. ? ?Subjective:  ? ?1. Pre-diabetes ?Previously on metformin; Ozempic  was denied by his insurance. ?On 11/12/2020, he was started on Mounjaro 2.5 mg. On 12/09/2020, Sean Kennedy was increased to 5 mg.  ?On 04/08/2021, Sean Kennedy was increased to 7.5 mg=GI upset, so decreased Mounjaro to 5 mg on 05/06/2021. ? ?2. Essential hypertension ?Sean Kennedy's ambulatory readings were SBP 1302, DBP 60-70.  ?He denies acute cardiac symptoms.  ?He endorses daily vape use. He replaced tobacco with vape 3 years ago.  ? ?3. At risk for heart disease ?Sean Kennedy is at higher than average risk for cardiovascular disease due to obesity and hypertension. ? ?Assessment/Plan:  ? ?1. Pre-diabetes ?Verner agreed to increase Mounjaro 7.5 mg once week, and we will refill for 1 month. ?Monitor for GI SE. ? ?- tirzepatide (MOUNJARO) 7.5 MG/0.5ML Pen; Inject 7.5 mg into the skin once a week.  Dispense: 6 mL; Refill: 0 ? ?2. Essential hypertension ?Sean Kennedy will continue antihypertensive regimen and decrease vape use.  ?Continue to monitor ambulatory BP. ? ?3. At risk for heart disease ?Sean Kennedy was  given approximately 15 minutes of coronary artery disease prevention counseling today. He is 41 y.o. male and has risk factors for heart disease including obesity. We discussed intensive lifestyle modifications today with an emphasis on specific weight loss instructions and strategies. ? ?Repetitive spaced learning was employed today to elicit superior memory formation and behavioral change.  ? ?4. Obesity with current BMI of 34.0 ?Sean Kennedy is currently in the action stage of change. As such, his goal is to continue with weight loss efforts. He has agreed to keeping a food journal and adhering to recommended goals of 1650-1800 calories and 125 grams of protein daily.  ? ?Exercise goals: All adults should avoid inactivity. Some physical activity is better than none, and adults who participate in any amount of physical activity gain some health benefits. ? ?Behavioral modification strategies: increasing lean protein intake, decreasing simple carbohydrates, meal planning and cooking strategies, keeping healthy foods in the home, and planning for success. ? ?Sean Kennedy has agreed to follow-up with our clinic in 3 to 4 weeks. He was informed of the importance of frequent follow-up visits to maximize his success with intensive lifestyle modifications for his multiple health conditions.  ? ?Objective:  ? ?Blood pressure 130/88, pulse 87, temperature 97.9 ?F (36.6 ?C), height 5\' 10"  (1.778 m), weight 236 lb (107 kg), SpO2 98 %. ?Body mass index is 33.86 kg/m?. ? ?General: Cooperative, alert, well developed, in no acute distress. ?HEENT: Conjunctivae and lids unremarkable. ?Cardiovascular: Regular rhythm.  ?Lungs: Normal work of breathing. ?Neurologic: No focal deficits.  ? ?  Lab Results  ?Component Value Date  ? CREATININE 1.27 06/07/2021  ? BUN 15 06/07/2021  ? NA 143 06/07/2021  ? K 4.5 06/07/2021  ? CL 104 06/07/2021  ? CO2 26 06/07/2021  ? ?Lab Results  ?Component Value Date  ? ALT 41 06/07/2021  ? AST 29 06/07/2021  ? ALKPHOS  79 06/07/2021  ? BILITOT 0.3 06/07/2021  ? ?Lab Results  ?Component Value Date  ? HGBA1C 5.6 06/07/2021  ? HGBA1C 5.7 (H) 12/09/2020  ? HGBA1C 5.8 (H) 04/20/2020  ? ?Lab Results  ?Component Value Date  ? INSULIN 8.4 06/07/2021  ? INSULIN 10.3 04/20/2020  ? ?Lab Results  ?Component Value Date  ? TSH 1.550 04/20/2020  ? ?Lab Results  ?Component Value Date  ? CHOL 149 06/07/2021  ? HDL 43 06/07/2021  ? LDLCALC 98 06/07/2021  ? TRIG 34 06/07/2021  ? CHOLHDL 3.8 12/09/2020  ? ?Lab Results  ?Component Value Date  ? VD25OH 53.1 06/07/2021  ? VD25OH 43.3 12/09/2020  ? VD25OH 20.1 (L) 04/20/2020  ? ?Lab Results  ?Component Value Date  ? WBC 3.7 04/20/2020  ? HGB 15.2 04/20/2020  ? HCT 45.3 04/20/2020  ? MCV 80 04/20/2020  ? PLT 236 04/20/2020  ? ?No results found for: IRON, TIBC, FERRITIN ? ?Attestation Statements:  ? ?Reviewed by clinician on day of visit: allergies, medications, problem list, medical history, surgical history, family history, social history, and previous encounter notes. ? ? ?I, Burt Knack, am acting as transcriptionist for William Hamburger, NP. ? ?I have reviewed the above documentation for accuracy and completeness, and I agree with the above. -  Morad Tal d. Alexcis Bicking, NP-C ? ?

## 2021-08-16 NOTE — Telephone Encounter (Signed)
Last OV with Katy 

## 2021-08-20 ENCOUNTER — Other Ambulatory Visit: Payer: Self-pay

## 2021-08-23 ENCOUNTER — Encounter: Payer: BC Managed Care – PPO | Admitting: Family Medicine

## 2021-08-23 ENCOUNTER — Other Ambulatory Visit: Payer: Self-pay

## 2021-08-31 ENCOUNTER — Ambulatory Visit (INDEPENDENT_AMBULATORY_CARE_PROVIDER_SITE_OTHER): Payer: BC Managed Care – PPO | Admitting: Adult Health

## 2021-08-31 ENCOUNTER — Encounter (INDEPENDENT_AMBULATORY_CARE_PROVIDER_SITE_OTHER): Payer: Self-pay | Admitting: Adult Health

## 2021-08-31 ENCOUNTER — Other Ambulatory Visit: Payer: Self-pay

## 2021-08-31 VITALS — BP 129/83 | HR 82 | Temp 98.2°F | Ht 70.0 in | Wt 236.0 lb

## 2021-08-31 DIAGNOSIS — E669 Obesity, unspecified: Secondary | ICD-10-CM | POA: Diagnosis not present

## 2021-08-31 DIAGNOSIS — R7303 Prediabetes: Secondary | ICD-10-CM

## 2021-08-31 DIAGNOSIS — R5383 Other fatigue: Secondary | ICD-10-CM

## 2021-08-31 DIAGNOSIS — I10 Essential (primary) hypertension: Secondary | ICD-10-CM | POA: Diagnosis not present

## 2021-08-31 DIAGNOSIS — Z9189 Other specified personal risk factors, not elsewhere classified: Secondary | ICD-10-CM

## 2021-08-31 DIAGNOSIS — Z6833 Body mass index (BMI) 33.0-33.9, adult: Secondary | ICD-10-CM

## 2021-08-31 MED ORDER — TIRZEPATIDE 7.5 MG/0.5ML ~~LOC~~ SOAJ
7.5000 mg | SUBCUTANEOUS | 0 refills | Status: DC
  Filled 2021-08-31: qty 2, 28d supply, fill #0

## 2021-08-31 MED ORDER — CHLORTHALIDONE 25 MG PO TABS: 25.0000 mg | ORAL_TABLET | Freq: Every day | ORAL | 0 refills | Status: DC

## 2021-09-08 NOTE — Progress Notes (Unsigned)
Chief Complaint:   OBESITY Sean Kennedy is here to discuss his progress with his obesity treatment plan along with follow-up of his obesity related diagnoses. Sean Kennedy is on the Category 4 Plan and keeping a food journal and adhering to recommended goals of 1650-1800 calories and 125 grams of protein and states he is following his eating plan approximately 80% of the time. Sean Kennedy states he is going to gym 60 minutes 3 times per week.  Today's visit was #: 20 Starting weight: 267 lbs Starting date: 04/20/2020 Today's weight: 236 lbs Today's date: 08/31/2021 Total lbs lost to date: 31 Total lbs lost since last in-office visit: 0  Interim History: Sean Kennedy's first dose of Mounjaro 7.5 mg was yesterday. Denies any side effects. Monday -upper body gym workout, on Friday -leg ***. Sean Kennedy gets 5 to 7k steps a day in at work.  Subjective:   1. Essential hypertension Avish is currently taking Losartan 100 mg daily and Chlorthalidone 25 mg daily. His home readings of blood pressure systolic 120's, diastolic 70's.  2. Pre-diabetes *** 3. Fatigue Berton endorses increased fatigue the last months. He sleeps 7 hours at night and he is exercising 3 times a week.   4. At risk for dehydration Sean Kennedy is at risk for dehydration due to diuretics and job that exposes him to extreme *** .  Assessment/Plan:   1. Essential hypertension We will refill Chlorthalidone 25 mg once a day for 3 months with no refills.  -Refill chlorthalidone (HYGROTON) 25 MG tablet; Take 1 tablet (25 mg total) by mouth daily.  Dispense: 90 tablet; Refill: 0  2. Pre-diabetes We will refill Mounjaro 7.5 mg once a week for 1 month with no refills.  -Refill tirzepatide (MOUNJARO) 7.5 MG/0.5ML Pen; Inject 7.5 mg into the skin once a week.  Dispense: 6 mL; Refill: 0  3. Fatigue We will check thyroid panel/ B12/ Vit D at next set of labs.   4. At risk for dehydration Sean Kennedy was given approximately 15 minutes of dehydration prevention  counseling today. Sean Kennedy is at risk for dehydration due to weight loss and current medication(s). He was encouraged to hydrate and monitor fluid status to avoid dehydration as weight loss plateaus.  5. Obesity with current BMI of 33.9 Sean Kennedy is currently in the action stage of change. As such, his goal is to continue with weight loss efforts. He has agreed to keeping a food journal and adhering to recommended goals of 1650-1800 calories and 125 grams of  protein daily.  Exercise goals: As is.  Behavioral modification strategies: increasing lean protein intake, decreasing simple carbohydrates, meal planning and cooking strategies, keeping healthy foods in the home, and planning for success.  Sean Kennedy has agreed to follow-up with our clinic in 4 weeks. He was informed of the importance of frequent follow-up visits to maximize his success with intensive lifestyle modifications for his multiple health conditions.   Objective:   Blood pressure 129/83, pulse 82, temperature 98.2 F (36.8 C), height 5\' 10"  (1.778 m), weight 236 lb (107 kg), SpO2 99 %. Body mass index is 33.86 kg/m.  General: Cooperative, alert, well developed, in no acute distress. HEENT: Conjunctivae and lids unremarkable. Cardiovascular: Regular rhythm.  Lungs: Normal work of breathing. Neurologic: No focal deficits.   Lab Results  Component Value Date   CREATININE 1.27 06/07/2021   BUN 15 06/07/2021   NA 143 06/07/2021   K 4.5 06/07/2021   CL 104 06/07/2021   CO2 26 06/07/2021   Lab Results  Component Value Date   ALT 41 06/07/2021   AST 29 06/07/2021   ALKPHOS 79 06/07/2021   BILITOT 0.3 06/07/2021   Lab Results  Component Value Date   HGBA1C 5.6 06/07/2021   HGBA1C 5.7 (H) 12/09/2020   HGBA1C 5.8 (H) 04/20/2020   Lab Results  Component Value Date   INSULIN 8.4 06/07/2021   INSULIN 10.3 04/20/2020   Lab Results  Component Value Date   TSH 1.550 04/20/2020   Lab Results  Component Value Date   CHOL 149  06/07/2021   HDL 43 06/07/2021   LDLCALC 98 06/07/2021   TRIG 34 06/07/2021   CHOLHDL 3.8 12/09/2020   Lab Results  Component Value Date   VD25OH 53.1 06/07/2021   VD25OH 43.3 12/09/2020   VD25OH 20.1 (L) 04/20/2020   Lab Results  Component Value Date   WBC 3.7 04/20/2020   HGB 15.2 04/20/2020   HCT 45.3 04/20/2020   MCV 80 04/20/2020   PLT 236 04/20/2020   No results found for: IRON, TIBC, FERRITIN  Attestation Statements:   Reviewed by clinician on day of visit: allergies, medications, problem list, medical history, surgical history, family history, social history, and previous encounter notes.  I, Randee Upchurch, RMA, am acting as transcriptionist for William Hamburger, NP.  I have reviewed the above documentation for accuracy and completeness, and I agree with the above. -  ***

## 2021-09-09 DIAGNOSIS — R5383 Other fatigue: Secondary | ICD-10-CM | POA: Insufficient documentation

## 2021-09-28 ENCOUNTER — Ambulatory Visit (INDEPENDENT_AMBULATORY_CARE_PROVIDER_SITE_OTHER): Payer: BC Managed Care – PPO | Admitting: Adult Health

## 2021-09-28 ENCOUNTER — Encounter (INDEPENDENT_AMBULATORY_CARE_PROVIDER_SITE_OTHER): Payer: Self-pay | Admitting: Adult Health

## 2021-09-28 ENCOUNTER — Other Ambulatory Visit: Payer: Self-pay

## 2021-09-28 VITALS — BP 116/80 | HR 86 | Temp 98.3°F | Ht 70.0 in | Wt 230.0 lb

## 2021-09-28 DIAGNOSIS — E669 Obesity, unspecified: Secondary | ICD-10-CM

## 2021-09-28 DIAGNOSIS — Z6833 Body mass index (BMI) 33.0-33.9, adult: Secondary | ICD-10-CM

## 2021-09-28 DIAGNOSIS — R7303 Prediabetes: Secondary | ICD-10-CM

## 2021-09-28 DIAGNOSIS — E559 Vitamin D deficiency, unspecified: Secondary | ICD-10-CM | POA: Diagnosis not present

## 2021-09-28 DIAGNOSIS — Z7985 Long-term (current) use of injectable non-insulin antidiabetic drugs: Secondary | ICD-10-CM

## 2021-09-28 MED ORDER — TIRZEPATIDE 7.5 MG/0.5ML ~~LOC~~ SOAJ
7.5000 mg | SUBCUTANEOUS | 0 refills | Status: DC
  Filled 2021-09-28 – 2021-10-05 (×2): qty 6, 84d supply, fill #0

## 2021-09-29 NOTE — Progress Notes (Signed)
Chief Complaint:   OBESITY Sean Kennedy is here to discuss his progress with his obesity treatment plan along with follow-up of his obesity related diagnoses. [name] is on keeping a food journal and adhering to recommended goals of 1650-1800 calories and 125 grams of protein and states he is following his eating plan approximately 85% of the time. [name] states he is lifting weights for 60 minutes 3 times per week.  Today's visit was #: 21 Starting weight: 267 lbs Starting date: 04/20/2020 Today's weight: 230 lbs Today's date: 09/28/2021 Total lbs lost to date: 37 Total lbs lost since last in-office visit: 6  Interim History:  She has provided the following food recall that is "typical of a day". Breakfast: [place], either chicken biscuit or chicken/egg/cheese.   Lunch:Frozen personal pizza.   Dinner is salad with protein.   Snack is protein shake.   He is not tracking his intake, he focuses on portion sizes of meals/snacks.  Subjective:   1. Pre-diabetes Sean Kennedy has had 3 doses of Mounjaro 7.5 mg weekly. She denies mass in neck, dysphagia, dyspepsia, persistent hoarseness, abd pain, or N/V/Constipation.  2. Vitamin D deficiency Sean Kennedy is on OTC men's Mega men's multivitamins.  Assessment/Plan:   1. Pre-diabetes We will refill Mounjaro 7.5 mg q. weekly for 1 month.  We will recheck labs at his next office visit.  - tirzepatide (MOUNJARO) 7.5 MG/0.5ML Pen; Inject 7.5 mg into the skin once a week.  Dispense: 6 mL; Refill: 0  2. Vitamin D deficiency We will recheck labs at his next office visit.  3. Obesity with current BMI of 33.0 Sean Kennedy is currently in the action stage of change. As such, his goal is to continue with weight loss efforts. He has agreed to keeping a food journal and adhering to recommended goals of 1650-1800 calories and 125 grams of protein daily.   We will recheck fasting labs at his next office visit.  Exercise goals: As is.  Behavioral modification  strategies: increasing lean protein intake, decreasing simple carbohydrates, meal planning and cooking strategies, keeping healthy foods in the home, and planning for success.  Sean Kennedy has agreed to follow-up with our clinic in 4 weeks. He was informed of the importance of frequent follow-up visits to maximize his success with intensive lifestyle modifications for his multiple health conditions.   Objective:   Blood pressure 116/80, pulse 86, temperature 98.3 F (36.8 C), height 5\' 10"  (1.778 m), weight 230 lb (104.3 kg), SpO2 99 %. Body mass index is 33 kg/m.  General: Cooperative, alert, well developed, in no acute distress. HEENT: Conjunctivae and lids unremarkable. Cardiovascular: Regular rhythm.  Lungs: Normal work of breathing. Neurologic: No focal deficits.   Lab Results  Component Value Date   CREATININE 1.27 06/07/2021   BUN 15 06/07/2021   NA 143 06/07/2021   K 4.5 06/07/2021   CL 104 06/07/2021   CO2 26 06/07/2021   Lab Results  Component Value Date   ALT 41 06/07/2021   AST 29 06/07/2021   ALKPHOS 79 06/07/2021   BILITOT 0.3 06/07/2021   Lab Results  Component Value Date   HGBA1C 5.6 06/07/2021   HGBA1C 5.7 (H) 12/09/2020   HGBA1C 5.8 (H) 04/20/2020   Lab Results  Component Value Date   INSULIN 8.4 06/07/2021   INSULIN 10.3 04/20/2020   Lab Results  Component Value Date   TSH 1.550 04/20/2020   Lab Results  Component Value Date   CHOL 149 06/07/2021   HDL 43  06/07/2021   LDLCALC 98 06/07/2021   TRIG 34 06/07/2021   CHOLHDL 3.8 12/09/2020   Lab Results  Component Value Date   VD25OH 53.1 06/07/2021   VD25OH 43.3 12/09/2020   VD25OH 20.1 (L) 04/20/2020   Lab Results  Component Value Date   WBC 3.7 04/20/2020   HGB 15.2 04/20/2020   HCT 45.3 04/20/2020   MCV 80 04/20/2020   PLT 236 04/20/2020   No results found for: "IRON", "TIBC", "FERRITIN"  Attestation Statements:   Reviewed by clinician on day of visit: allergies, medications,  problem list, medical history, surgical history, family history, social history, and previous encounter notes.   Trude Mcburney, am acting as transcriptionist for William Hamburger, NP.  I have reviewed the above documentation for accuracy and completeness, and I agree with the above. - Tomesha Sargent d. Tacy Chavis, NP-C

## 2021-10-05 ENCOUNTER — Encounter (INDEPENDENT_AMBULATORY_CARE_PROVIDER_SITE_OTHER): Payer: Self-pay

## 2021-10-05 ENCOUNTER — Telehealth (INDEPENDENT_AMBULATORY_CARE_PROVIDER_SITE_OTHER): Payer: Self-pay | Admitting: Adult Health

## 2021-10-05 ENCOUNTER — Encounter (INDEPENDENT_AMBULATORY_CARE_PROVIDER_SITE_OTHER): Payer: Self-pay | Admitting: Adult Health

## 2021-10-05 ENCOUNTER — Other Ambulatory Visit: Payer: Self-pay

## 2021-10-05 NOTE — Telephone Encounter (Signed)
Sean Kennedy - Prior authorization denied for [employer]. Patient already used copay card. Patient sent denial message via mychart.

## 2021-10-06 ENCOUNTER — Encounter (INDEPENDENT_AMBULATORY_CARE_PROVIDER_SITE_OTHER): Payer: Self-pay | Admitting: Adult Health

## 2021-10-11 ENCOUNTER — Ambulatory Visit (INDEPENDENT_AMBULATORY_CARE_PROVIDER_SITE_OTHER): Payer: BC Managed Care – PPO | Admitting: Podiatry

## 2021-10-11 DIAGNOSIS — B351 Tinea unguium: Secondary | ICD-10-CM

## 2021-10-11 MED ORDER — TERBINAFINE HCL 250 MG PO TABS: 250.0000 mg | ORAL_TABLET | Freq: Every day | ORAL | 0 refills | Status: AC

## 2021-10-14 ENCOUNTER — Encounter: Payer: Self-pay | Admitting: Podiatry

## 2021-10-14 NOTE — Progress Notes (Signed)
  Subjective:  Patient ID: Sean Kennedy, male    DOB: April 12, 1981,  MRN: [id number]  Chief Complaint  Patient presents with   Nail Problem    NAIL FUNGUS TX, 4 month follow up    41 y.o. male presents with the above complaint. History confirmed with patient.  Overall doing well the toenails are feeling much better the skin has cleared up completely  Objective:  Physical Exam: warm, good capillary refill, no trophic changes or ulcerative lesions, normal DP and PT pulses, normal sensory exam, onychomycosis with proximal clearing much improved since last visit, and tinea pedis is no longer present.       Assessment:   1. Onychomycosis      Plan:  Patient was evaluated and treated and all questions answered.  Overall doing well his tinea pedis has resolved and his onychomycosis has nearly fully resolved as well.  I recommended a repeat course of the terbinafine Lamisil he tolerated this well had no side effects.  This was sent to his pharmacy.  I will see him back in 4 months for follow-up.  Photographs were taken.  Return in about 4 months (around 02/10/2022) for follow up after nail fungus treatment.

## 2021-10-15 NOTE — Progress Notes (Signed)
Complete physical exam   Patient: Sean Kennedy   DOB: 1981/01/21   41 y.o. Male  MRN: [id number] Visit Date: 10/22/2021  Today's healthcare provider: Jacky Kindle, FNP  Patient presents for new patient visit to establish care.  Introduced to Publishing rights manager role and practice setting.  All questions answered.  Discussed provider/patient relationship and expectations.   I,Tiffany J Bragg,acting as a scribe for Jacky Kindle, FNP.,have documented all relevant documentation on the behalf of Jacky Kindle, FNP,as directed by  Jacky Kindle, FNP while in the presence of Jacky Kindle, FNP.   Chief Complaint  Patient presents with   Annual Exam   Back Pain   Subjective    Sean Kennedy is a 41 y.o. male who presents today for a complete physical exam.  He reports consuming a general diet. Gym/ health club routine includes light weights. He generally feels well. He reports sleeping poorly. He does have additional problems to discuss today.   HPI HPI     Back Pain   This is a new problem.  Recent episode started 1 to 4 weeks ago.  The problem has been gradually worsening since onset.  Pain is lumbar spine and thoracic spine.  Treatments: NSAIDs.      Last edited by Marlana Salvage, CMA on 10/22/2021 10:56 AM.       Past Medical History:  Diagnosis Date   Heartburn    Hypertension    Past Surgical History:  Procedure Laterality Date   SHOULDER SURGERY Right    VASECTOMY  07/11/2019   Social History   Socioeconomic History   Marital status: Married    Spouse name: Not on file   Number of children: Not on file   Years of education: Not on file   Highest education level: Not on file  Occupational History   Not on file  Tobacco Use   Smoking status: Never   Smokeless tobacco: Never  Substance and Sexual Activity   Alcohol use: Yes    Comment: occasional   Drug use: Never   Sexual activity: Yes    Birth control/protection: Surgical    Comment: Vasectomy  07/11/2019  Other Topics Concern   Not on file  Social History Narrative   Not on file   Social Determinants of Health   Financial Resource Strain: Not on file  Food Insecurity: Not on file  Transportation Needs: Not on file  Physical Activity: Not on file  Stress: Not on file  Social Connections: Not on file  Intimate Partner Violence: Not on file   Family Status  Relation Name Status   Mother  (Not Specified)   Father  (Not Specified)   Sister  (Not Specified)   Family History  Problem Relation Age of Onset   Hypertension Mother    Hypertension Father    Alcoholism Father    Drug abuse Father    Hypertension Sister    No Known Allergies  Patient Care Team: Erasmo Downer, MD as PCP - General (Family Medicine)   Medications: Outpatient Medications Prior to Visit  Medication Sig   terbinafine (LAMISIL) 250 MG tablet Take 1 tablet (250 mg total) by mouth daily.   [DISCONTINUED] chlorthalidone (HYGROTON) 25 MG tablet Take 1 tablet (25 mg total) by mouth daily.   [DISCONTINUED] losartan (COZAAR) 100 MG tablet Take 1 tablet (100 mg total) by mouth daily.   [DISCONTINUED] ketoconazole (NIZORAL) 2 % cream Apply 1 application  topically daily.   [DISCONTINUED] tirzepatide (MOUNJARO) 7.5 MG/0.5ML Pen Inject 7.5 mg into the skin once a week.   No facility-administered medications prior to visit.    Review of Systems    Objective     BP 121/82 (BP Location: Left Arm, Patient Position: Sitting, Cuff Size: Large)   Pulse 92   Temp 99 F (37.2 C) (Oral)   Resp 16   Ht  (1.778 m)   Wt 231 lb (104.8 kg)   SpO2 99%   BMI 33.15 kg/m     Physical Exam Vitals and nursing note reviewed.  Constitutional:      General: He is awake. He is not in acute distress.    Appearance: Normal appearance. He is well-developed and well-groomed. He is obese. He is not ill-appearing, toxic-appearing or diaphoretic.  HENT:     Head: Normocephalic and atraumatic.     Jaw:  There is normal jaw occlusion. No trismus, tenderness, swelling or pain on movement.     Salivary Glands: Right salivary gland is not diffusely enlarged or tender. Left salivary gland is not diffusely enlarged or tender.     Right Ear: Hearing, tympanic membrane, ear canal and external ear normal. There is no impacted cerumen.     Left Ear: Hearing, tympanic membrane, ear canal and external ear normal. There is no impacted cerumen.     Nose: Nose normal. No congestion or rhinorrhea.     Right Turbinates: Not enlarged, swollen or pale.     Left Turbinates: Not enlarged, swollen or pale.     Right Sinus: No maxillary sinus tenderness or frontal sinus tenderness.     Left Sinus: No maxillary sinus tenderness or frontal sinus tenderness.     Mouth/Throat:     Lips: Pink.     Mouth: Mucous membranes are moist. No injury, lacerations, oral lesions or angioedema.     Pharynx: Oropharynx is clear. Uvula midline. No pharyngeal swelling, oropharyngeal exudate or posterior oropharyngeal erythema.     Tonsils: No tonsillar exudate or tonsillar abscesses.  Eyes:     General: Lids are normal. Vision grossly intact. Gaze aligned appropriately.        Right eye: No discharge.        Left eye: No discharge.     Extraocular Movements: Extraocular movements intact.     Conjunctiva/sclera: Conjunctivae normal.     Pupils: Pupils are equal, round, and reactive to light.  Neck:     Thyroid: No thyroid mass, thyromegaly or thyroid tenderness.     Vascular: No carotid bruit.     Trachea: Trachea normal. No tracheal tenderness.  Cardiovascular:     Rate and Rhythm: Normal rate and regular rhythm.     Pulses: Normal pulses.          Carotid pulses are 2+ on the right side and 2+ on the left side.      Radial pulses are 2+ on the right side and 2+ on the left side.       Femoral pulses are 2+ on the right side and 2+ on the left side.      Popliteal pulses are 2+ on the right side and 2+ on the left side.        Dorsalis pedis pulses are 2+ on the right side and 2+ on the left side.       Posterior tibial pulses are 2+ on the right side and 2+ on the left side.  Heart sounds: Normal heart sounds, S1 normal and S2 normal. No murmur heard.    No friction rub. No gallop.  Pulmonary:     Effort: Pulmonary effort is normal. No respiratory distress.     Breath sounds: Normal breath sounds and air entry. No stridor. No wheezing, rhonchi or rales.  Chest:     Chest wall: No tenderness.  Abdominal:     General: Abdomen is flat. Bowel sounds are normal. There is no distension.     Palpations: Abdomen is soft. There is no mass.     Tenderness: There is no abdominal tenderness. There is no guarding or rebound.     Hernia: No hernia is present.  Genitourinary:    Comments: Exam deferred; denies complaints Musculoskeletal:        General: No swelling, tenderness, deformity or signs of injury. Normal range of motion.     Cervical back: Normal range of motion and neck supple. No rigidity or tenderness.     Right lower leg: No edema.     Left lower leg: No edema.  Lymphadenopathy:     Cervical: No cervical adenopathy.     Right cervical: No superficial, deep or posterior cervical adenopathy.    Left cervical: No superficial, deep or posterior cervical adenopathy.  Skin:    General: Skin is warm and dry.     Capillary Refill: Capillary refill takes less than 2 seconds.     Coloration: Skin is not jaundiced or pale.     Findings: No bruising, erythema, lesion or rash.  Neurological:     General: No focal deficit present.     Mental Status: He is alert and oriented to person, place, and time. Mental status is at baseline.     GCS: GCS eye subscore is 4. GCS verbal subscore is 5. GCS motor subscore is 6.     Sensory: Sensation is intact. No sensory deficit.     Motor: Motor function is intact. No weakness.     Coordination: Coordination is intact.     Gait: Gait is intact.  Psychiatric:        Attention  and Perception: Attention and perception normal.        Mood and Affect: Mood and affect normal.        Speech: Speech normal.        Behavior: Behavior normal. Behavior is cooperative.        Thought Content: Thought content normal.        Cognition and Memory: Cognition normal.        Judgment: Judgment normal.      Last depression screening scores    10/22/2021   11:00 AM 05/03/2021    9:20 AM 04/20/2020    7:56 AM  PHQ 2/9 Scores  PHQ - 2 Score 0 0 2  PHQ- 9 Score 3 1 5    Last fall risk screening    10/22/2021   11:00 AM  Fall Risk   Falls in the past year? 0  Number falls in past yr: 0  Injury with Fall? 0   Last Audit-C alcohol use screening    10/22/2021   11:01 AM  Alcohol Use Disorder Test (AUDIT)  1. How often do you have a drink containing alcohol? 1  2. How many drinks containing alcohol do you have on a typical day when you are drinking? 0  3. How often do you have six or more drinks on one occasion? 0  AUDIT-C Score 1  A score of 3 or more in women, and 4 or more in men indicates increased risk for alcohol abuse, EXCEPT if all of the points are from question 1   No results found for any visits on 10/22/21.  Assessment & Plan    Routine Health Maintenance and Physical Exam  Exercise Activities and Dietary recommendations  Goals   None     Immunization History  Administered Date(s) Administered   PFIZER(Purple Top)SARS-COV-2 Vaccination 07/06/2019, 07/30/2019, 01/30/2020   Tdap 03/11/2019    Health Maintenance  Topic Date Due   Hepatitis C Screening  Never done   COVID-19 Vaccine (4 - Pfizer series) 03/26/2020   INFLUENZA VACCINE  11/16/2021   TETANUS/TDAP  03/10/2029   HIV Screening  Completed   HPV VACCINES  Aged Out    Discussed health benefits of physical activity, and encouraged him to engage in regular exercise appropriate for his age and condition.  Problem List Items Addressed This Visit       Cardiovascular and Mediastinum    Essential hypertension    Chronic, stable Denies CP Denies SOB/ DOE Denies low blood pressure/hypotension Denies vision changes No LE Edema noted on exam Continue medication, Losartan 100 mg and Chlorthalidone 25 mg Endorses side effects including slight dizziness upon standing from kneeling position   Seek emergent care if you develop chest pain or chest pressure       Relevant Medications   chlorthalidone (HYGROTON) 25 MG tablet   losartan (COZAAR) 100 MG tablet     Endocrine   History of prediabetes    Chronic, previous on mounjaro to assist  Repeat A1c Continue to recommend balanced, lower carb meals. Smaller meal size, adding snacks. Choosing water as drink of choice and increasing purposeful exercise.       Relevant Orders   Hemoglobin A1c     Nervous and Auditory   Chronic bilateral low back pain with bilateral sciatica    Chronic, exacerbated with excessive standing/walking Reports nerve disturbances feeling like water is being poured on his legs occasionally Can happen at any time; not affiliated with movement Wishes for referral to ortho       Relevant Medications   DULoxetine (CYMBALTA) 30 MG capsule   Other Relevant Orders   Ambulatory referral to Orthopedic Surgery     Other   Annual physical exam - Primary    UTD on dental UTD on vision Married, 6 kids from 18-2 years  Things to do to keep yourself healthy  - Exercise at least 30-45 minutes a day, 3-4 days a week.  - Eat a low-fat diet with lots of fruits and vegetables, up to 7-9 servings per day.  - Seatbelts can save your life. Wear them always.  - Smoke detectors on every level of your home, check batteries every year.  - Eye Doctor - have an eye exam every 1-2 years  - Safe sex - if you may be exposed to STDs, use a condom.  - Alcohol -  If you drink, do it moderately, less than 2 drinks per day.  - Health Care Power of Attorney. Choose someone to speak for you if you are not able.  -  Depression is common in our stressful world.If you're feeling down or losing interest in things you normally enjoy, please come in for a visit.  - Violence - If anyone is threatening or hurting you, please call immediately.        Relevant Orders   Comprehensive metabolic panel  CBC with Differential/Platelet   TSH + free T4   Lipid panel   Encounter for hepatitis C screening test for low risk patient    Low risk screen Treatable, and curable. If left untreated Hep C can lead to cirrhosis and liver failure. Encourage routine testing; recommend repeat testing if risk factors change.       Relevant Orders   Hepatitis C Antibody   Encounter for screening for human immunodeficiency virus (HIV)    Low risk screen Consented; encouraged to "know your status" Recommend repeat screen if risk factors change       Relevant Orders   HIV antibody (with reflex)   GAD (generalized anxiety disorder)    Chronic, previously untreated Recommend start of SSRI/SNRI to assist Will Start Cymbalta 30 mg daily Discussed potential side effects, incl GI upset, sexual dysfunction, increased anxiety, and SI Discussed that it can take 6-8 weeks to reach full efficacy Contracted for safety - no SI/HI Discussed synergistic effects of medications and therapy       Relevant Medications   DULoxetine (CYMBALTA) 30 MG capsule   Screening PSA (prostate specific antigen)    Denies LUTS; however, may get up 1x/night for bathroom Check PSA Defer DRE       Relevant Orders   PSA     Return in about 8 weeks (around 12/17/2021) for anxiety and depression.     Leilani Merl, FNP, have reviewed all documentation for this visit. The documentation on 10/22/21 for the exam, diagnosis, procedures, and orders are all accurate and complete.    Jacky Kindle, FNP  [facility] [phone] (phone) [phone] (fax)  [facility]

## 2021-10-22 ENCOUNTER — Encounter: Payer: BC Managed Care – PPO | Admitting: Family Medicine

## 2021-10-22 ENCOUNTER — Ambulatory Visit (INDEPENDENT_AMBULATORY_CARE_PROVIDER_SITE_OTHER): Payer: BC Managed Care – PPO | Admitting: Family Medicine

## 2021-10-22 ENCOUNTER — Encounter: Payer: Self-pay | Admitting: Family Medicine

## 2021-10-22 VITALS — BP 121/82 | HR 92 | Temp 99.0°F | Resp 16 | Ht 70.0 in | Wt 231.0 lb

## 2021-10-22 DIAGNOSIS — Z1159 Encounter for screening for other viral diseases: Secondary | ICD-10-CM | POA: Insufficient documentation

## 2021-10-22 DIAGNOSIS — I10 Essential (primary) hypertension: Secondary | ICD-10-CM

## 2021-10-22 DIAGNOSIS — Z0000 Encounter for general adult medical examination without abnormal findings: Secondary | ICD-10-CM | POA: Insufficient documentation

## 2021-10-22 DIAGNOSIS — Z87898 Personal history of other specified conditions: Secondary | ICD-10-CM | POA: Diagnosis not present

## 2021-10-22 DIAGNOSIS — Z114 Encounter for screening for human immunodeficiency virus [HIV]: Secondary | ICD-10-CM | POA: Insufficient documentation

## 2021-10-22 DIAGNOSIS — M5441 Lumbago with sciatica, right side: Secondary | ICD-10-CM

## 2021-10-22 DIAGNOSIS — Z125 Encounter for screening for malignant neoplasm of prostate: Secondary | ICD-10-CM | POA: Insufficient documentation

## 2021-10-22 DIAGNOSIS — G8929 Other chronic pain: Secondary | ICD-10-CM

## 2021-10-22 DIAGNOSIS — F411 Generalized anxiety disorder: Secondary | ICD-10-CM | POA: Diagnosis not present

## 2021-10-22 DIAGNOSIS — M5442 Lumbago with sciatica, left side: Secondary | ICD-10-CM

## 2021-10-22 MED ORDER — DULOXETINE HCL 30 MG PO CPEP: 30.0000 mg | ORAL_CAPSULE | Freq: Every day | ORAL | 1 refills | Status: DC

## 2021-10-22 MED ORDER — CHLORTHALIDONE 25 MG PO TABS: 25.0000 mg | ORAL_TABLET | Freq: Every day | ORAL | 3 refills | Status: DC

## 2021-10-22 MED ORDER — LOSARTAN POTASSIUM 100 MG PO TABS: 100.0000 mg | ORAL_TABLET | Freq: Every day | ORAL | 3 refills | Status: DC

## 2021-10-22 NOTE — Assessment & Plan Note (Signed)
Chronic, stable Denies CP Denies SOB/ DOE Denies low blood pressure/hypotension Denies vision changes No LE Edema noted on exam Continue medication, Losartan 100 mg and Chlorthalidone 25 mg Endorses side effects including slight dizziness upon standing from kneeling position   Seek emergent care if you develop chest pain or chest pressure

## 2021-10-22 NOTE — Assessment & Plan Note (Signed)
Low risk screen Treatable, and curable. If left untreated Hep C can lead to cirrhosis and liver failure. Encourage routine testing; recommend repeat testing if risk factors change.  

## 2021-10-22 NOTE — Assessment & Plan Note (Signed)
UTD on dental UTD on vision Married, 6 kids from 18-2 years  Things to do to keep yourself healthy  - Exercise at least 30-45 minutes a day, 3-4 days a week.  - Eat a low-fat diet with lots of fruits and vegetables, up to 7-9 servings per day.  - Seatbelts can save your life. Wear them always.  - Smoke detectors on every level of your home, check batteries every year.  - Eye Doctor - have an eye exam every 1-2 years  - Safe sex - if you may be exposed to STDs, use a condom.  - Alcohol -  If you drink, do it moderately, less than 2 drinks per day.  - Health Care Power of Attorney. Choose someone to speak for you if you are not able.  - Depression is common in our stressful world.If you're feeling down or losing interest in things you normally enjoy, please come in for a visit.  - Violence - If anyone is threatening or hurting you, please call immediately.

## 2021-10-22 NOTE — Assessment & Plan Note (Signed)
Chronic, exacerbated with excessive standing/walking Reports nerve disturbances feeling like water is being poured on his legs occasionally Can happen at any time; not affiliated with movement Wishes for referral to ortho

## 2021-10-22 NOTE — Assessment & Plan Note (Signed)
Chronic, previous on mounjaro to assist  Repeat A1c Continue to recommend balanced, lower carb meals. Smaller meal size, adding snacks. Choosing water as drink of choice and increasing purposeful exercise.

## 2021-10-22 NOTE — Assessment & Plan Note (Signed)
Low risk screen ?Consented; encouraged to "know your status" ?Recommend repeat screen if risk factors change ? ?

## 2021-10-22 NOTE — Assessment & Plan Note (Signed)
Chronic, previously untreated Recommend start of SSRI/SNRI to assist Will Start Cymbalta 30 mg daily Discussed potential side effects, incl GI upset, sexual dysfunction, increased anxiety, and SI Discussed that it can take 6-8 weeks to reach full efficacy Contracted for safety - no SI/HI Discussed synergistic effects of medications and therapy

## 2021-10-22 NOTE — Assessment & Plan Note (Signed)
Denies LUTS; however, may get up 1x/night for bathroom Check PSA Defer DRE

## 2021-10-23 LAB — COMPREHENSIVE METABOLIC PANEL
ALT: 35 IU/L (ref 0–44)
AST: 28 IU/L (ref 0–40)
Albumin/Globulin Ratio: 1.9 (ref 1.2–2.2)
Albumin: 4.6 g/dL (ref 4.0–5.0)
Alkaline Phosphatase: 57 IU/L (ref 44–121)
BUN/Creatinine Ratio: 16 (ref 9–20)
BUN: 20 mg/dL (ref 6–24)
Bilirubin Total: 0.4 mg/dL (ref 0.0–1.2)
CO2: 25 mmol/L (ref 20–29)
Calcium: 9.8 mg/dL (ref 8.7–10.2)
Chloride: 101 mmol/L (ref 96–106)
Creatinine, Ser: 1.24 mg/dL (ref 0.76–1.27)
Globulin, Total: 2.4 g/dL (ref 1.5–4.5)
Glucose: 78 mg/dL (ref 70–99)
Potassium: 3.8 mmol/L (ref 3.5–5.2)
Sodium: 140 mmol/L (ref 134–144)
Total Protein: 7 g/dL (ref 6.0–8.5)
eGFR: 75 mL/min/{1.73_m2} (ref >59)

## 2021-10-23 LAB — CBC WITH DIFFERENTIAL/PLATELET
Basophils Absolute: 0 10*3/uL (ref 0.0–0.2)
Basos: 1 %
EOS (ABSOLUTE): 0.1 10*3/uL (ref 0.0–0.4)
Eos: 2 %
Hematocrit: 44.7 % (ref 37.5–51.0)
Hemoglobin: 14.6 g/dL (ref 13.0–17.7)
Immature Grans (Abs): 0 10*3/uL (ref 0.0–0.1)
Immature Granulocytes: 0 %
Lymphocytes Absolute: 1.3 10*3/uL (ref 0.7–3.1)
Lymphs: 32 %
MCH: 26.5 pg — ABNORMAL LOW (ref 26.6–33.0)
MCHC: 32.7 g/dL (ref 31.5–35.7)
MCV: 81 fL (ref 79–97)
Monocytes Absolute: 0.3 10*3/uL (ref 0.1–0.9)
Monocytes: 7 %
Neutrophils Absolute: 2.3 10*3/uL (ref 1.4–7.0)
Neutrophils: 58 %
Platelets: 269 10*3/uL (ref 150–450)
RBC: 5.51 x10E6/uL (ref 4.14–5.80)
RDW: 13.7 % (ref 11.6–15.4)
WBC: 4 10*3/uL (ref 3.4–10.8)

## 2021-10-23 LAB — LIPID PANEL
Chol/HDL Ratio: 3.5 ratio (ref 0.0–5.0)
Cholesterol, Total: 143 mg/dL (ref 100–199)
HDL: 41 mg/dL (ref >39)
LDL Chol Calc (NIH): 93 mg/dL (ref 0–99)
Triglycerides: 38 mg/dL (ref 0–149)
VLDL Cholesterol Cal: 9 mg/dL (ref 5–40)

## 2021-10-23 LAB — HIV ANTIBODY (ROUTINE TESTING W REFLEX): HIV Screen 4th Generation wRfx: NONREACTIVE

## 2021-10-23 LAB — TSH+FREE T4
Free T4: 1.29 ng/dL (ref 0.82–1.77)
TSH: 0.844 u[IU]/mL (ref 0.450–4.500)

## 2021-10-23 LAB — HEMOGLOBIN A1C
Est. average glucose Bld gHb Est-mCnc: 114 mg/dL
Hgb A1c MFr Bld: 5.6 % (ref 4.8–5.6)

## 2021-10-23 LAB — PSA: Prostate Specific Ag, Serum: 1.2 ng/mL (ref 0.0–4.0)

## 2021-10-23 LAB — HEPATITIS C ANTIBODY: Hep C Virus Ab: NONREACTIVE

## 2021-10-25 NOTE — Progress Notes (Signed)
Normal cell counts. Normal blood chemistry. Normal thyroid. Stable A1c; borderline pre-diabetes.  Normal PSA. Non-reactive/negative HIV/Hep Sean Hock, FNP  [facility] [address] [place], [place] [address] [phone] (phone) [phone] (fax) [facility]

## 2021-11-03 ENCOUNTER — Encounter (INDEPENDENT_AMBULATORY_CARE_PROVIDER_SITE_OTHER): Payer: Self-pay | Admitting: Adult Health

## 2021-11-03 ENCOUNTER — Ambulatory Visit (INDEPENDENT_AMBULATORY_CARE_PROVIDER_SITE_OTHER): Payer: BC Managed Care – PPO | Admitting: Adult Health

## 2021-11-03 VITALS — BP 114/70 | HR 71 | Temp 98.2°F | Ht 70.0 in | Wt 226.0 lb

## 2021-11-03 DIAGNOSIS — E559 Vitamin D deficiency, unspecified: Secondary | ICD-10-CM

## 2021-11-03 DIAGNOSIS — Z9189 Other specified personal risk factors, not elsewhere classified: Secondary | ICD-10-CM

## 2021-11-03 DIAGNOSIS — R7303 Prediabetes: Secondary | ICD-10-CM | POA: Diagnosis not present

## 2021-11-03 DIAGNOSIS — F419 Anxiety disorder, unspecified: Secondary | ICD-10-CM | POA: Insufficient documentation

## 2021-11-03 DIAGNOSIS — I10 Essential (primary) hypertension: Secondary | ICD-10-CM | POA: Diagnosis not present

## 2021-11-03 DIAGNOSIS — E669 Obesity, unspecified: Secondary | ICD-10-CM

## 2021-11-03 DIAGNOSIS — Z6832 Body mass index (BMI) 32.0-32.9, adult: Secondary | ICD-10-CM

## 2021-11-04 LAB — VITAMIN D 25 HYDROXY (VIT D DEFICIENCY, FRACTURES): Vit D, 25-Hydroxy: 46.2 ng/mL (ref 30.0–100.0)

## 2021-11-04 LAB — INSULIN, RANDOM: INSULIN: 6.3 u[IU]/mL (ref 2.6–24.9)

## 2021-11-08 NOTE — Progress Notes (Signed)
Chief Complaint:   OBESITY Sean Kennedy is here to discuss his progress with his obesity treatment plan along with follow-up of his obesity related diagnoses. Sean Kennedy is on keeping a food journal and adhering to recommended goals of 1650-1800 calories and 125 grams of protein daily and states he is following his eating plan approximately 90% of the time. Sean Kennedy states he is at the gym and lifting weights for 60 minutes 3 times per week.  Today's visit was #: 22 Starting weight: 267 lbs Starting date: 04/20/2020 Today's weight: 226 lbs Today's date: 11/03/2021 Total lbs lost to date: 41 Total lbs lost since last in-office visit: 4  Interim History:  On 10/22/2021 annual physical with labs with his PCP. Reviewed lab results with the patient.   Last dose of Mounjaro 7.5 mg on 10/04/2021, unable to obtain any future refills, savings card expired.  Subjective:   1. Essential hypertension On 10/22/2021, CMP-electrolytes and kidney function stable.   Sean Kennedy is on losartan 100 mg daily, chlorthalidone 25 mg daily.   He endorses family history of hypertension:his mother, father, sister.  2. Prediabetes Sean Kennedy denies family history of MTC or MENS 2.  He denies a personal history of pancreatitis. Last dose of Mounjaro 7.5 10/01/21- savings card expired.  3. Vitamin D deficiency Sean Kennedy is not on any vitamin D supplementation at present.  4. Anxiety Sean Kennedy PCP started him on Cymbalta 30 mg, and he has yet to start the prescription.   He endorses increased anxiety symptoms: consistent thoughts of worry and racing thoughts at night. He denies SI/HI.  5. At risk for diabetes mellitus Sean Kennedy is at higher than average risk for developing diabetes due to his obesity and prediabetes.  Assessment/Plan:   1. Essential hypertension Sean Kennedy will continue his ARB and diuretic.  2. Prediabetes We will check labs today.   Sean Kennedy will consider Sean Kennedy therapy in Fall 2023 (after supply is restored).  - Insulin,  random  3. Vitamin D deficiency We will check labs today, and we will follow-up at Sean Kennedy next office visit.  - VITAMIN D 25 Hydroxy (Vit-D Deficiency, Fractures)  4. Anxiety Sean Kennedy agreed to start Cymbalta 30 mg per his PCP.  5. At risk for diabetes mellitus Sean Kennedy was given approximately 15 minutes of diabetic education and counseling today. We discussed intensive lifestyle modifications today with an emphasis on weight loss as well as increasing exercise and decreasing simple carbohydrates in his diet. We also reviewed medication options with an emphasis on risk versus benefits of those discussed.  Repetitive spaced learning was employed today to elicit superior memory formation and behavioral change.  6. Obesity, current BMI 32.5 Sean Kennedy is currently in the action stage of change. As such, his goal is to continue with weight loss efforts. He has agreed to practicing portion control and making smarter food choices, such as increasing vegetables and decreasing simple carbohydrates.   Exercise goals: As is.   Behavioral modification strategies: increasing lean protein intake, decreasing simple carbohydrates, meal planning and cooking strategies, keeping healthy foods in the home, and planning for success.  Sean Kennedy has agreed to follow-up with our clinic in 4 weeks. He was informed of the importance of frequent follow-up visits to maximize his success with intensive lifestyle modifications for his multiple health conditions.   Objective:   Blood pressure 114/70, pulse 71, temperature 98.2 F (36.8 C), height 5\' 10"  (1.778 m), weight 226 lb (102.5 kg), SpO2 99 %. Body mass index is 32.43 kg/m.  General: Cooperative,  alert, well developed, in no acute distress. HEENT: Conjunctivae and lids unremarkable. Cardiovascular: Regular rhythm.  Lungs: Normal work of breathing. Neurologic: No focal deficits.   Lab Results  Component Value Date   CREATININE 1.24 10/22/2021   BUN 20 10/22/2021    NA 140 10/22/2021   K 3.8 10/22/2021   CL 101 10/22/2021   CO2 25 10/22/2021   Lab Results  Component Value Date   ALT 35 10/22/2021   AST 28 10/22/2021   ALKPHOS 57 10/22/2021   BILITOT 0.4 10/22/2021   Lab Results  Component Value Date   HGBA1C 5.6 10/22/2021   HGBA1C 5.6 06/07/2021   HGBA1C 5.7 (H) 12/09/2020   HGBA1C 5.8 (H) 04/20/2020   Lab Results  Component Value Date   INSULIN 6.3 11/03/2021   INSULIN 8.4 06/07/2021   INSULIN 10.3 04/20/2020   Lab Results  Component Value Date   TSH 0.844 10/22/2021   Lab Results  Component Value Date   CHOL 143 10/22/2021   HDL 41 10/22/2021   LDLCALC 93 10/22/2021   TRIG 38 10/22/2021   CHOLHDL 3.5 10/22/2021   Lab Results  Component Value Date   VD25OH 46.2 11/03/2021   VD25OH 53.1 06/07/2021   VD25OH 43.3 12/09/2020   Lab Results  Component Value Date   WBC 4.0 10/22/2021   HGB 14.6 10/22/2021   HCT 44.7 10/22/2021   MCV 81 10/22/2021   PLT 269 10/22/2021   No results found for: "IRON", "TIBC", "FERRITIN"  Attestation Statements:   Reviewed by clinician on day of visit: allergies, medications, problem list, medical history, surgical history, family history, social history, and previous encounter notes.   Trude Mcburney, am acting as transcriptionist for William Hamburger, NP.  I have reviewed the above documentation for accuracy and completeness, and I agree with the above. -  Monserratt Knezevic d. Masao Junker, NP-C

## 2021-11-24 ENCOUNTER — Encounter (INDEPENDENT_AMBULATORY_CARE_PROVIDER_SITE_OTHER): Payer: Self-pay

## 2021-11-26 DIAGNOSIS — M62830 Muscle spasm of back: Secondary | ICD-10-CM | POA: Diagnosis not present

## 2021-11-26 DIAGNOSIS — M545 Low back pain, unspecified: Secondary | ICD-10-CM | POA: Diagnosis not present

## 2021-11-26 DIAGNOSIS — M47816 Spondylosis without myelopathy or radiculopathy, lumbar region: Secondary | ICD-10-CM | POA: Diagnosis not present

## 2021-11-26 DIAGNOSIS — M546 Pain in thoracic spine: Secondary | ICD-10-CM | POA: Diagnosis not present

## 2021-12-08 ENCOUNTER — Encounter (INDEPENDENT_AMBULATORY_CARE_PROVIDER_SITE_OTHER): Payer: Self-pay | Admitting: Adult Health

## 2021-12-08 ENCOUNTER — Ambulatory Visit (INDEPENDENT_AMBULATORY_CARE_PROVIDER_SITE_OTHER): Payer: BC Managed Care – PPO | Admitting: Adult Health

## 2021-12-08 VITALS — BP 129/83 | HR 71 | Temp 98.2°F | Ht 70.0 in | Wt 238.0 lb

## 2021-12-08 DIAGNOSIS — I10 Essential (primary) hypertension: Secondary | ICD-10-CM

## 2021-12-08 DIAGNOSIS — Z6834 Body mass index (BMI) 34.0-34.9, adult: Secondary | ICD-10-CM

## 2021-12-08 DIAGNOSIS — E669 Obesity, unspecified: Secondary | ICD-10-CM | POA: Diagnosis not present

## 2021-12-08 DIAGNOSIS — E559 Vitamin D deficiency, unspecified: Secondary | ICD-10-CM

## 2021-12-08 MED ORDER — VITAMIN D (ERGOCALCIFEROL) 1.25 MG (50000 UNIT) PO CAPS: 50000.0000 [IU] | ORAL_CAPSULE | ORAL | 0 refills | Status: DC

## 2021-12-11 NOTE — Progress Notes (Unsigned)
Chief Complaint:   OBESITY Sean Kennedy is here to discuss his progress with his obesity treatment plan along with follow-up of his obesity related diagnoses. Sean Kennedy is on practicing portion control and making smarter food choices, such as increasing vegetables and decreasing simple carbohydrates and states he is following his eating plan approximately 20% of the time. Sean Kennedy states he is not exercising.   Today's visit was #: 23 Starting weight: 267 lbs Starting date: 04/20/2020 Today's weight: 238 lbs Today's date: 12/08/2021 Total lbs lost to date: 29 lbs Total lbs lost since last in-office visit: +12 lbs  Interim History: He has been traveling frequently since last office visit.  [address], [address].  His wife underwent plastic surgery, tummy tuck with fat ***  Subjective:   1. Essential hypertension Blood pressure and heart rate at goal  2. Vitamin D deficiency Discussed labs with patient today. 11/03/2021, Vitamin D level 46.2.   Assessment/Plan:   1. Essential hypertension Continue Hygroton 25 mg daily and Cozaar 100 mg daily.   2. Vitamin D deficiency Refill - Vitamin D, Ergocalciferol, (DRISDOL) 1.25 MG (50000 UNIT) CAPS capsule; Take 1 capsule (50,000 Units total) by mouth every 7 (seven) days.  Dispense: 8 capsule; Refill: 0  3. Obesity, current BMI 34.3 Sean Kennedy is currently in the action stage of change. As such, his goal is to continue with weight loss efforts. He has agreed to the Category 4 Plan.   Exercise goals: For substantial health benefits, adults should do at least 150 minutes (2 hours and 30 minutes) a week of moderate-intensity, or 75 minutes (1 hour and 15 minutes) a week of vigorous-intensity aerobic physical activity, or an equivalent combination of moderate- and vigorous-intensity aerobic activity. Aerobic activity should be performed in episodes of at least 10 minutes, and preferably, it should be spread throughout the week.  Behavioral modification  strategies: increasing lean protein intake, decreasing simple carbohydrates, meal planning and cooking strategies, keeping healthy foods in the home, and planning for success.  Sean Kennedy has agreed to follow-up with our clinic in 3-4 weeks. He was informed of the importance of frequent follow-up visits to maximize his success with intensive lifestyle modifications for his multiple health conditions.   Objective:   Blood pressure 129/83, pulse 71, temperature 98.2 F (36.8 C), height 5\' 10"  (1.778 m), weight 238 lb (108 kg), SpO2 98 %. Body mass index is 34.15 kg/m.  General: Cooperative, alert, well developed, in no acute distress. HEENT: Conjunctivae and lids unremarkable. Cardiovascular: Regular rhythm.  Lungs: Normal work of breathing. Neurologic: No focal deficits.   Lab Results  Component Value Date   CREATININE 1.24 10/22/2021   BUN 20 10/22/2021   NA 140 10/22/2021   K 3.8 10/22/2021   CL 101 10/22/2021   CO2 25 10/22/2021   Lab Results  Component Value Date   ALT 35 10/22/2021   AST 28 10/22/2021   ALKPHOS 57 10/22/2021   BILITOT 0.4 10/22/2021   Lab Results  Component Value Date   HGBA1C 5.6 10/22/2021   HGBA1C 5.6 06/07/2021   HGBA1C 5.7 (H) 12/09/2020   HGBA1C 5.8 (H) 04/20/2020   Lab Results  Component Value Date   INSULIN 6.3 11/03/2021   INSULIN 8.4 06/07/2021   INSULIN 10.3 04/20/2020   Lab Results  Component Value Date   TSH 0.844 10/22/2021   Lab Results  Component Value Date   CHOL 143 10/22/2021   HDL 41 10/22/2021   LDLCALC 93 10/22/2021   TRIG  38 10/22/2021   CHOLHDL 3.5 10/22/2021   Lab Results  Component Value Date   VD25OH 46.2 11/03/2021   VD25OH 53.1 06/07/2021   VD25OH 43.3 12/09/2020   Lab Results  Component Value Date   WBC 4.0 10/22/2021   HGB 14.6 10/22/2021   HCT 44.7 10/22/2021   MCV 81 10/22/2021   PLT 269 10/22/2021   No results found for: "IRON", "TIBC", "FERRITIN"  Attestation Statements:   Reviewed by  clinician on day of visit: allergies, medications, problem list, medical history, surgical history, family history, social history, and previous encounter notes.  I, Malcolm Metro, RMA, am acting as Energy manager for William Hamburger, NP.  I have reviewed the above documentation for accuracy and completeness, and I agree with the above. -  ***

## 2021-12-21 NOTE — Progress Notes (Signed)
Virtual telephone visit    Virtual Visit via Telephone Note   This visit type was conducted due to national recommendations for restrictions regarding the COVID-19 Pandemic (e.g. social distancing) in an effort to limit this patient's exposure and mitigate transmission in our community. Due to his co-morbid illnesses, this patient is at least at moderate risk for complications without adequate follow up. This format is felt to be most appropriate for this patient at this time. The patient did not have access to video technology or had technical difficulties with video requiring transitioning to audio format only (telephone). Physical exam was limited to content and character of the telephone converstion.    Patient location: home Provider location: [facility] [address]  [address] [place], [place] [address]   I discussed the limitations of evaluation and management by telemedicine and the availability of in person appointments. The patient expressed understanding and agreed to proceed.   Visit Date: 12/23/2021  Today's healthcare provider: Jacky Kindle, FNP   I,Tiffany J Bragg,acting as a scribe for Jacky Kindle, FNP.,have documented all relevant documentation on the behalf of Jacky Kindle, FNP,as directed by  Jacky Kindle, FNP while in the presence of Jacky Kindle, FNP.   Chief Complaint  Patient presents with   Anxiety   Subjective    HPI  Anxiety, Follow-up  He was last seen for anxiety 2 months ago. Changes made at last visit include start cymbalta 30mg .   He reports poor compliance with treatment. States he took it only for a couple days, then stopped due to fatigue.  He reports poor tolerance of treatment. He is having side effects. Fatigue.   He feels his anxiety is mild and Unchanged since last visit.  Symptoms: No chest pain No difficulty concentrating  No dizziness No fatigue  No feelings of losing control No insomnia  No  irritable No palpitations  No panic attacks No racing thoughts  No shortness of breath No sweating  No tremors/shakes    GAD-7 Results    12/23/2021    3:57 PM  GAD-7 Generalized Anxiety Disorder Screening Tool  1. Feeling Nervous, Anxious, or on Edge 1  2. Not Being Able to Stop or Control Worrying 2  3. Worrying Too Much About Different Things 1  4. Trouble Relaxing 0  5. Being So Restless it's Hard To Sit Still 1  6. Becoming Easily Annoyed or Irritable 0  7. Feeling Afraid As If Something Awful Might Happen 0  Total GAD-7 Score 5  Difficulty At Work, Home, or Getting  Along With Others? Not difficult at all    PHQ-9 Scores    12/23/2021    3:54 PM 10/22/2021   11:00 AM 05/03/2021    9:20 AM  PHQ9 SCORE ONLY  PHQ-9 Total Score 3 3 1     ---------------------------------------------------------------------------------------------------     Medications: Outpatient Medications Prior to Visit  Medication Sig   chlorthalidone (HYGROTON) 25 MG tablet Take 1 tablet (25 mg total) by mouth daily.   DULoxetine (CYMBALTA) 30 MG capsule Take 1 capsule (30 mg total) by mouth daily.   losartan (COZAAR) 100 MG tablet Take 1 tablet (100 mg total) by mouth daily.   terbinafine (LAMISIL) 250 MG tablet Take 1 tablet (250 mg total) by mouth daily.   Vitamin D, Ergocalciferol, (DRISDOL) 1.25 MG (50000 UNIT) CAPS capsule Take 1 capsule (50,000 Units total) by mouth every 7 (seven) days.   No facility-administered medications prior to  visit.    Review of Systems   Objective    There were no vitals taken for this visit.   Assessment & Plan     Problem List Items Addressed This Visit       Other   Anxiety - Primary    Chronic, stable Failed SNRI d/t fatigue; failed AM and PM trials with use Did not allow full transition of 6 weeks or reach back to provider Contracted for safety; start new agent today  RTC in 6 weeks      Relevant Medications   buPROPion (WELLBUTRIN XL) 150 MG 24  hr tablet   Obesity (BMI 30.0-34.9)    Chronic, no weight noted today given telephone visit Previously followed by weight mgmt Continue to recommend balanced, lower carb meals. Smaller meal size, adding snacks. Choosing water as drink of choice and increasing purposeful exercise. Wellbutrin XR 150 mg may assist with 'food noise' to assist weight loss efforts       Relevant Medications   buPROPion (WELLBUTRIN XL) 150 MG 24 hr tablet     Return in about 6 weeks (around 02/03/2022) for anxiety and depression.    I discussed the assessment and treatment plan with the patient. The patient was provided an opportunity to ask questions and all were answered. The patient agreed with the plan and demonstrated an understanding of the instructions.   The patient was advised to call back or seek an in-person evaluation if the symptoms worsen or if the condition fails to improve as anticipated.  I provided 5 minutes of non-face-to-face time during this encounter discussing previous failed trial of SNRI; will change to Wellbutrin to assist with weight loss efforts and decreased risk for fatigue.  Leilani Merl, FNP, have reviewed all documentation for this visit. The documentation on 12/23/21 for the exam, diagnosis, procedures, and orders are all accurate and complete.   Jacky Kindle, FNP [facility] [phone] (phone) [phone] (fax)  [facility]

## 2021-12-23 ENCOUNTER — Ambulatory Visit (INDEPENDENT_AMBULATORY_CARE_PROVIDER_SITE_OTHER): Payer: BC Managed Care – PPO | Admitting: Family Medicine

## 2021-12-23 ENCOUNTER — Encounter: Payer: Self-pay | Admitting: Family Medicine

## 2021-12-23 DIAGNOSIS — E669 Obesity, unspecified: Secondary | ICD-10-CM | POA: Insufficient documentation

## 2021-12-23 DIAGNOSIS — F419 Anxiety disorder, unspecified: Secondary | ICD-10-CM

## 2021-12-23 MED ORDER — BUPROPION HCL ER (XL) 150 MG PO TB24: 150.0000 mg | ORAL_TABLET | Freq: Every day | ORAL | 1 refills | Status: DC

## 2021-12-23 NOTE — Assessment & Plan Note (Signed)
Chronic, no weight noted today given telephone visit Previously followed by weight mgmt Continue to recommend balanced, lower carb meals. Smaller meal size, adding snacks. Choosing water as drink of choice and increasing purposeful exercise. Wellbutrin XR 150 mg may assist with 'food noise' to assist weight loss efforts

## 2021-12-23 NOTE — Assessment & Plan Note (Signed)
Chronic, stable Failed SNRI d/t fatigue; failed AM and PM trials with use Did not allow full transition of 6 weeks or reach back to provider Contracted for safety; start new agent today  RTC in 6 weeks

## 2021-12-30 ENCOUNTER — Ambulatory Visit (INDEPENDENT_AMBULATORY_CARE_PROVIDER_SITE_OTHER): Payer: BC Managed Care – PPO | Admitting: Adult Health

## 2021-12-30 ENCOUNTER — Encounter (INDEPENDENT_AMBULATORY_CARE_PROVIDER_SITE_OTHER): Payer: Self-pay | Admitting: Adult Health

## 2021-12-30 VITALS — BP 129/78 | HR 81 | Temp 98.5°F | Ht 70.0 in | Wt 239.0 lb

## 2021-12-30 DIAGNOSIS — I10 Essential (primary) hypertension: Secondary | ICD-10-CM

## 2021-12-30 DIAGNOSIS — E669 Obesity, unspecified: Secondary | ICD-10-CM

## 2021-12-30 DIAGNOSIS — Z6834 Body mass index (BMI) 34.0-34.9, adult: Secondary | ICD-10-CM | POA: Diagnosis not present

## 2021-12-30 DIAGNOSIS — F419 Anxiety disorder, unspecified: Secondary | ICD-10-CM | POA: Diagnosis not present

## 2022-01-02 NOTE — Progress Notes (Unsigned)
Chief Complaint:   OBESITY Sean Kennedy is here to discuss his progress with his obesity treatment plan along with follow-up of his obesity related diagnoses. Sean Kennedy is on the Category 4 Plan and states he is following his eating plan approximately 75% of the time. Sean Kennedy states he is not exercising.   Today's visit was #: 24 Starting weight: 267 lbs Starting date: 239 lbs Today's weight: 239 lbs Today's date: 12/30/2021 Total lbs lost to date: 28 lbs Total lbs lost since last in-office visit: +1 lb  Interim History: K3, 7438-20 pm, 41 year old child.  Wife remotely, healthy air, 6 employee.   Subjective:   1. Essential hypertension Blood pressure heart rate at office visit.   2. Anxiety PCP weaned him off Duloxetine.  Started him on Buproprion XL 150 mg daily.   Assessment/Plan:   1. Essential hypertension Continue chlorthalidone 25 mg daily, losartan 100 mg daily.   2. Anxiety Continue daily Buproprion XL 150 mg daily.   3. Obesity, current BMI 34.4 Sean Kennedy is currently in the action stage of change. As such, his goal is to continue with weight loss efforts. He has agreed to the Category 4 Plan.   Exercise goals:  Exercise, gym, 1-2 times per week.   Behavioral modification strategies: increasing lean protein intake, decreasing simple carbohydrates, no skipping meals, meal planning and cooking strategies, and planning for success.  Sean Kennedy has agreed to follow-up with our clinic in 4 weeks. He was informed of the importance of frequent follow-up visits to maximize his success with intensive lifestyle modifications for his multiple health conditions.   Objective:   Blood pressure 129/78, pulse 81, temperature 98.5 F (36.9 C), height 5\' 10"  (1.778 m), weight 239 lb (108.4 kg), SpO2 98 %. Body mass index is 34.29 kg/m.  General: Cooperative, alert, well developed, in no acute distress. HEENT: Conjunctivae and lids unremarkable. Cardiovascular: Regular rhythm.  Lungs: Normal  work of breathing. Neurologic: No focal deficits.   Lab Results  Component Value Date   CREATININE 1.24 10/22/2021   BUN 20 10/22/2021   NA 140 10/22/2021   K 3.8 10/22/2021   CL 101 10/22/2021   CO2 25 10/22/2021   Lab Results  Component Value Date   ALT 35 10/22/2021   AST 28 10/22/2021   ALKPHOS 57 10/22/2021   BILITOT 0.4 10/22/2021   Lab Results  Component Value Date   HGBA1C 5.6 10/22/2021   HGBA1C 5.6 06/07/2021   HGBA1C 5.7 (H) 12/09/2020   HGBA1C 5.8 (H) 04/20/2020   Lab Results  Component Value Date   INSULIN 6.3 11/03/2021   INSULIN 8.4 06/07/2021   INSULIN 10.3 04/20/2020   Lab Results  Component Value Date   TSH 0.844 10/22/2021   Lab Results  Component Value Date   CHOL 143 10/22/2021   HDL 41 10/22/2021   LDLCALC 93 10/22/2021   TRIG 38 10/22/2021   CHOLHDL 3.5 10/22/2021   Lab Results  Component Value Date   VD25OH 46.2 11/03/2021   VD25OH 53.1 06/07/2021   VD25OH 43.3 12/09/2020   Lab Results  Component Value Date   WBC 4.0 10/22/2021   HGB 14.6 10/22/2021   HCT 44.7 10/22/2021   MCV 81 10/22/2021   PLT 269 10/22/2021   No results found for: "IRON", "TIBC", "FERRITIN"  Attestation Statements:   Reviewed by clinician on day of visit: allergies, medications, problem list, medical history, surgical history, family history, social history, and previous encounter notes.  I, 12/23/2021,  RMA, am acting as Location manager for Mina Marble, NP.   I have reviewed the above documentation for accuracy and completeness, and I agree with the above. -  ***

## 2022-01-25 ENCOUNTER — Other Ambulatory Visit: Payer: Self-pay

## 2022-01-25 ENCOUNTER — Ambulatory Visit (INDEPENDENT_AMBULATORY_CARE_PROVIDER_SITE_OTHER): Payer: BC Managed Care – PPO | Admitting: Adult Health

## 2022-01-25 ENCOUNTER — Encounter (INDEPENDENT_AMBULATORY_CARE_PROVIDER_SITE_OTHER): Payer: Self-pay | Admitting: Adult Health

## 2022-01-25 VITALS — BP 118/83 | HR 94 | Temp 98.5°F | Ht 70.0 in | Wt 241.0 lb

## 2022-01-25 DIAGNOSIS — Z6834 Body mass index (BMI) 34.0-34.9, adult: Secondary | ICD-10-CM | POA: Diagnosis not present

## 2022-01-25 DIAGNOSIS — E559 Vitamin D deficiency, unspecified: Secondary | ICD-10-CM | POA: Diagnosis not present

## 2022-01-25 DIAGNOSIS — E669 Obesity, unspecified: Secondary | ICD-10-CM | POA: Diagnosis not present

## 2022-01-25 DIAGNOSIS — R7303 Prediabetes: Secondary | ICD-10-CM | POA: Diagnosis not present

## 2022-01-25 MED ORDER — VITAMIN D (ERGOCALCIFEROL) 1.25 MG (50000 UNIT) PO CAPS
50000.0000 [IU] | ORAL_CAPSULE | ORAL | 0 refills | Status: DC
  Filled 2022-01-25: qty 8, 56d supply, fill #0

## 2022-01-26 ENCOUNTER — Ambulatory Visit (INDEPENDENT_AMBULATORY_CARE_PROVIDER_SITE_OTHER): Payer: BC Managed Care – PPO | Admitting: Podiatry

## 2022-01-26 ENCOUNTER — Ambulatory Visit (INDEPENDENT_AMBULATORY_CARE_PROVIDER_SITE_OTHER): Payer: BC Managed Care – PPO

## 2022-01-26 DIAGNOSIS — M2012 Hallux valgus (acquired), left foot: Secondary | ICD-10-CM

## 2022-01-26 DIAGNOSIS — B351 Tinea unguium: Secondary | ICD-10-CM

## 2022-01-26 DIAGNOSIS — M21612 Bunion of left foot: Secondary | ICD-10-CM

## 2022-01-26 MED ORDER — MELOXICAM 15 MG PO TABS: 15.0000 mg | ORAL_TABLET | Freq: Every day | ORAL | 3 refills | Status: DC

## 2022-01-27 ENCOUNTER — Telehealth: Payer: Self-pay

## 2022-01-27 NOTE — Telephone Encounter (Signed)
Received surgery paperwork from the [place] office. Left a message for Nesta to call and schedule surgery with Dr. Sherryle Lis.

## 2022-01-30 NOTE — Progress Notes (Addendum)
  Subjective:  Patient ID: Sean Kennedy, male    DOB: 1980-08-03,  MRN: [id number]  Chief Complaint  Patient presents with   Nail Problem    Thick painful toenails, 4 month follow up     41 y.o. male presents with the above complaint. History confirmed with patient.  Overall doing well. Starting to have worsening bunion pain on both feet but especially the left foot. Is interested in surgery. Has tried wearing wider shoes but has not helped.  Is affecting his daily function and lifestyle including standing for long periods of time, work, and walking.  He has tried relief with over-the-counter oral anti-inflammatories as well which has not helped.  Objective:  Physical Exam: warm, good capillary refill, no trophic changes or ulcerative lesions, normal DP and PT pulses with good perfusion to foot, normal sensory exam, great improvement in nail fungus. Bilaterally he has hallux valgus deformity with hypermobility of the first ray with deviation of the great toe towards the lesser toes. Pain on medial eminence at the first metatarsal head. Valgus rotation of the great toe    Weightbearing radiographs of the left foot taken today showed increase first intermetatarsal angle, deviated and rotated sesamoid complex, hallux valgus present with increased hallux abductus angle with deviation towards the lesser toes, and hallux distal articular set angle.  Bone density appears to be normal and adequate. Assessment:   1. Hallux valgus with bunions, left   2. Onychomycosis      Plan:  Patient was evaluated and treated and all questions answered.  Overall doing well with his nail fungus treatment recommend no further medical therapy at this point allow nails to grow out and he will let me know if it worsens or recurs.   Discussed the etiology and treatment including surgical and non surgical treatment for painful bunions.  He has exhausted all non surgical treatment prior to this visit including shoe  gear changes and padding and use of over-the-counter anti-inflammatories.. He desires surgical intervention. We discussed all risks including but not limited to: pain, swelling, infection, scar, numbness which may be temporary or permanent, chronic pain, stiffness, nerve pain or damage, wound healing problems, bone healing problems including delayed or non-union and recurrence. Specifically we discussed the following procedures: Lapidus bunionectomy of the first ray with bone graft from the heel. Informed consent was signed today. Surgery will be scheduled at a mutually agreeable date. Information regarding this will be forwarded to our surgery scheduler. Rx for meloxicam for pain control for now since over-the-counter medications have not been helpful.   Surgical plan:  Procedure: -left foot lapidus bunionectomy, bone graft from heel, possible Akin  Location: -[facility]  Anesthesia plan: -IV sedation with regional block  Postoperative pain plan: - Tylenol 1000 mg every 6 hours, ibuprofen 600 mg every 6 hours, gabapentin 300 mg every 8 hours x5 days, oxycodone 5 mg 1-2 tabs every 6 hours only as needed  DVT prophylaxis: -None required  WB Restrictions / DME needs: -NWB in splint post op w/ Knee scooter   No follow-ups on file.

## 2022-01-31 ENCOUNTER — Other Ambulatory Visit: Payer: Self-pay | Admitting: Family Medicine

## 2022-01-31 DIAGNOSIS — F419 Anxiety disorder, unspecified: Secondary | ICD-10-CM

## 2022-01-31 DIAGNOSIS — E669 Obesity, unspecified: Secondary | ICD-10-CM

## 2022-02-01 NOTE — Progress Notes (Signed)
Chief Complaint:   OBESITY Sean Kennedy is here to discuss his progress with his obesity treatment plan along with follow-up of his obesity related diagnoses. Sean Kennedy is on the Category 4 Plan and states he is following his eating plan approximately 75% of the time. Sean Kennedy states he is not exercising.   Today's visit was #: 24 Starting weight: 267 lbs Starting date: 04/20/2020 Today's weight: 241 lbs Today's date: 01/25/2022 Total lbs lost to date: 26 lbs Total lbs lost since last in-office visit: +2 lbs  Interim History:  His wife continues to experience complications r/t plastic surgery. An abscess developed on her right hip and lower back that both required- Incision and drainage. Open abdominal wound that is being treated with a wound vac. Her surgeon is out of state- [place]. She is being managed by local [facility] center. Recommend that she be seen by Infectious Disease specialist.  These complications and his family/work commitments have eliminated time to exercise. He estimates to be following the prescribed meal plan at least 75% of the time.  Subjective:   1. Prediabetes Lab Results  Component Value Date   HGBA1C 5.6 10/22/2021   HGBA1C 5.6 06/07/2021   HGBA1C 5.7 (H) 12/09/2020    Last dose of Mounjaro 7.5 mg was on 10/01/2021. Savings card expired and he is not diabetic, therefore his insurance will not cover Mounjaro therapy.  He denies family hx of MENS 2 or MTC.  He denies personal hx of pancreatitis. Discussed risk/benefits of Wegovy therapy.  2. Vitamin D deficiency 11/03/2021, Vitamin D level 46.2.  He is on weekly Ergocalciferol- tolerating well.  Assessment/Plan:   1. Prediabetes Start Wegovy therapy once [employer] has resolved.  2. Vitamin D deficiency Refill - Vitamin D, Ergocalciferol, (DRISDOL) 1.25 MG (50000 UNIT) CAPS capsule; Take 1 capsule (50,000 Units total) by mouth every 7 (seven) days.  Dispense: 8 capsule; Refill: 0  3.  Obesity, current BMI 34.6 Sean Kennedy is currently in the action stage of change. As such, his goal is to continue with weight loss efforts. He has agreed to the Category 4 Plan.   Exercise goals:  As is.   Behavioral modification strategies: increasing lean protein intake, decreasing simple carbohydrates, meal planning and cooking strategies, keeping healthy foods in the home, and planning for success.  Sean Kennedy has agreed to follow-up with our clinic in 4 weeks. He was informed of the importance of frequent follow-up visits to maximize his success with intensive lifestyle modifications for his multiple health conditions.   Objective:   Blood pressure 118/83, pulse 94, temperature 98.5 F (36.9 C), height 5\' 10"  (1.778 m), weight 241 lb (109.3 kg), SpO2 99 %. Body mass index is 34.58 kg/m.  General: Cooperative, alert, well developed, in no acute distress. HEENT: Conjunctivae and lids unremarkable. Cardiovascular: Regular rhythm.  Lungs: Normal work of breathing. Neurologic: No focal deficits.   Lab Results  Component Value Date   CREATININE 1.24 10/22/2021   BUN 20 10/22/2021   NA 140 10/22/2021   K 3.8 10/22/2021   CL 101 10/22/2021   CO2 25 10/22/2021   Lab Results  Component Value Date   ALT 35 10/22/2021   AST 28 10/22/2021   ALKPHOS 57 10/22/2021   BILITOT 0.4 10/22/2021   Lab Results  Component Value Date   HGBA1C 5.6 10/22/2021   HGBA1C 5.6 06/07/2021   HGBA1C 5.7 (H) 12/09/2020   HGBA1C 5.8 (H) 04/20/2020   Lab Results  Component Value Date  INSULIN 6.3 11/03/2021   INSULIN 8.4 06/07/2021   INSULIN 10.3 04/20/2020   Lab Results  Component Value Date   TSH 0.844 10/22/2021   Lab Results  Component Value Date   CHOL 143 10/22/2021   HDL 41 10/22/2021   LDLCALC 93 10/22/2021   TRIG 38 10/22/2021   CHOLHDL 3.5 10/22/2021   Lab Results  Component Value Date   VD25OH 46.2 11/03/2021   VD25OH 53.1 06/07/2021   VD25OH 43.3 12/09/2020   Lab Results   Component Value Date   WBC 4.0 10/22/2021   HGB 14.6 10/22/2021   HCT 44.7 10/22/2021   MCV 81 10/22/2021   PLT 269 10/22/2021   No results found for: "IRON", "TIBC", "FERRITIN"  Attestation Statements:   Reviewed by clinician on day of visit: allergies, medications, problem list, medical history, surgical history, family history, social history, and previous encounter notes.  I, Sean Kennedy, RMA, am acting as Energy manager for William Hamburger, NP.  I have reviewed the above documentation for accuracy and completeness, and I agree with the above. -  Sean Kennedy d. Sean Ericson, NP-C

## 2022-02-09 ENCOUNTER — Ambulatory Visit: Payer: BC Managed Care – PPO | Admitting: Podiatry

## 2022-02-23 ENCOUNTER — Other Ambulatory Visit: Payer: Self-pay

## 2022-02-23 ENCOUNTER — Encounter (INDEPENDENT_AMBULATORY_CARE_PROVIDER_SITE_OTHER): Payer: Self-pay | Admitting: Family Medicine

## 2022-02-23 ENCOUNTER — Ambulatory Visit (INDEPENDENT_AMBULATORY_CARE_PROVIDER_SITE_OTHER): Payer: BC Managed Care – PPO | Admitting: Family Medicine

## 2022-02-23 VITALS — BP 142/88 | HR 75 | Temp 99.1°F | Ht 70.0 in | Wt 244.8 lb

## 2022-02-23 DIAGNOSIS — R7303 Prediabetes: Secondary | ICD-10-CM | POA: Diagnosis not present

## 2022-02-23 DIAGNOSIS — I10 Essential (primary) hypertension: Secondary | ICD-10-CM | POA: Insufficient documentation

## 2022-02-23 DIAGNOSIS — E559 Vitamin D deficiency, unspecified: Secondary | ICD-10-CM | POA: Diagnosis not present

## 2022-02-23 DIAGNOSIS — E669 Obesity, unspecified: Secondary | ICD-10-CM

## 2022-02-23 DIAGNOSIS — Z6835 Body mass index (BMI) 35.0-35.9, adult: Secondary | ICD-10-CM

## 2022-02-23 MED ORDER — METFORMIN HCL 500 MG PO TABS
500.0000 mg | ORAL_TABLET | Freq: Every day | ORAL | 0 refills | Status: DC
  Filled 2022-02-23: qty 30, 30d supply, fill #0

## 2022-02-23 MED ORDER — VITAMIN D (ERGOCALCIFEROL) 1.25 MG (50000 UNIT) PO CAPS
50000.0000 [IU] | ORAL_CAPSULE | ORAL | 0 refills | Status: DC
  Filled 2022-02-23: qty 8, 56d supply, fill #0

## 2022-03-01 ENCOUNTER — Other Ambulatory Visit: Payer: Self-pay

## 2022-03-01 ENCOUNTER — Encounter: Payer: Self-pay | Admitting: Family Medicine

## 2022-03-01 ENCOUNTER — Ambulatory Visit (INDEPENDENT_AMBULATORY_CARE_PROVIDER_SITE_OTHER): Payer: BC Managed Care – PPO | Admitting: Family Medicine

## 2022-03-01 VITALS — BP 131/86 | HR 71 | Temp 98.2°F | Resp 16 | Wt 252.0 lb

## 2022-03-01 DIAGNOSIS — H65191 Other acute nonsuppurative otitis media, right ear: Secondary | ICD-10-CM | POA: Diagnosis not present

## 2022-03-01 DIAGNOSIS — H6991 Unspecified Eustachian tube disorder, right ear: Secondary | ICD-10-CM

## 2022-03-01 MED ORDER — PREDNISONE 20 MG PO TABS
40.0000 mg | ORAL_TABLET | Freq: Every day | ORAL | 0 refills | Status: AC
  Filled 2022-03-01: qty 14, 7d supply, fill #0

## 2022-03-01 NOTE — Progress Notes (Signed)
I,Sulibeya S Dimas,acting as a scribe for Shirlee Latch, MD.,have documented all relevant documentation on the behalf of Shirlee Latch, MD,as directed by  Shirlee Latch, MD while in the presence of Shirlee Latch, MD.     Established patient visit   Patient: Sean Kennedy   DOB: Aug 12, 1980   41 y.o. Male  MRN: [id number] Visit Date: 03/01/2022  Today's healthcare provider: Shirlee Latch, MD   Chief Complaint  Patient presents with   Ear Pain   Subjective    Otalgia  There is pain in the right ear. This is a new problem. The current episode started in the past 7 days. The problem has been unchanged. There has been no fever. The pain is mild. Pertinent negatives include no coughing, ear discharge, hearing loss, rhinorrhea or sore throat. He has tried nothing for the symptoms. His past medical history is significant for a chronic ear infection.    X5d, sounds like an earthquake.  Had ear infections in the R ear as a kid, none as an adult.  Medications: Outpatient Medications Prior to Visit  Medication Sig   buPROPion (WELLBUTRIN XL) 150 MG 24 hr tablet Take 1 tablet (150 mg total) by mouth daily.   chlorthalidone (HYGROTON) 25 MG tablet Take 1 tablet (25 mg total) by mouth daily.   losartan (COZAAR) 100 MG tablet Take 1 tablet (100 mg total) by mouth daily.   meloxicam (MOBIC) 15 MG tablet Take 1 tablet (15 mg total) by mouth daily.   metFORMIN (GLUCOPHAGE) 500 MG tablet Take 1 tablet (500 mg total) by mouth daily with lunch.   Vitamin D, Ergocalciferol, (DRISDOL) 1.25 MG (50000 UNIT) CAPS capsule Take 1 capsule (50,000 Units total) by mouth every 7 (seven) days.   No facility-administered medications prior to visit.    Review of Systems  Constitutional:  Negative for appetite change, chills and fever.  HENT:  Positive for ear pain. Negative for ear discharge, hearing loss, rhinorrhea and sore throat.   Respiratory:  Negative for cough and shortness of  breath.        Objective    BP 131/86 (BP Location: Right Arm, Patient Position: Sitting, Cuff Size: Large)   Pulse 71   Temp 98.2 F (36.8 C) (Oral)   Resp 16   Wt 252 lb (114.3 kg)   SpO2 99%   BMI 36.16 kg/m  BP Readings from Last 3 Encounters:  03/01/22 131/86  02/23/22 (!) 142/88  01/25/22 118/83   Wt Readings from Last 3 Encounters:  03/01/22 252 lb (114.3 kg)  02/23/22 244 lb 12.8 oz (111 kg)  01/25/22 241 lb (109.3 kg)      Physical Exam Constitutional:      General: He is not in acute distress.    Appearance: Normal appearance. He is not diaphoretic.  HENT:     Head: Normocephalic.     Right Ear: Ear canal and external ear normal. A middle ear effusion is present. Tympanic membrane is scarred. Tympanic membrane is not perforated, erythematous or retracted.     Left Ear: Tympanic membrane, ear canal and external ear normal.     Nose: Nose normal.  Eyes:     Conjunctiva/sclera: Conjunctivae normal.  Cardiovascular:     Rate and Rhythm: Normal rate.  Pulmonary:     Effort: Pulmonary effort is normal. No respiratory distress.  Neurological:     Mental Status: He is alert and oriented to person, place, and time. Mental status is at baseline.  No results found for any visits on 03/01/22.  Assessment & Plan     Problem List Items Addressed This Visit   None Visit Diagnoses     Acute effusion of right ear    -  Primary   Dysfunction of right eustachian tube          - new problem - no signs of infection on exam, but does have effusion and likely eustachian tube dysfunction - gave option of PO prednisone vs flonase nasal spray - will proceed with prednisone as below - return precautions discussed  Meds ordered this encounter  Medications   predniSONE (DELTASONE) 20 MG tablet    Sig: Take 2 tablets (40 mg total) by mouth daily with breakfast for 7 days.    Dispense:  14 tablet    Refill:  0     No follow-ups on file.      I, Shirlee Latch, MD, have reviewed all documentation for this visit. The documentation on 03/01/22 for the exam, diagnosis, procedures, and orders are all accurate and complete.   Marlise Fahr, Marzella Schlein, MD, MPH [facility]

## 2022-03-08 NOTE — Progress Notes (Signed)
Chief Complaint:   OBESITY Sean Kennedy is here to discuss his progress with his obesity treatment plan along with follow-up of his obesity related diagnoses. [name] is on the Category 4 Plan and states he is following his eating plan approximately 60% of the time. [name] states he is not exercising.   Today's visit was #: 25 Starting weight: 267 lbs Starting date: 04/20/2020 Today's weight: 244 lbs Today's date: 110/11/2021 Total lbs lost to date: 23 lbs Total lbs lost since last in-office visit: +3 lbs  Interim History: He has been helping his wife with surgical complications which is taking a lot of emotional and physical time.  He has been eating out more, working out less.  He has gained fat mass, but no loss in muscle mass.   Subjective:   1. Prediabetes With eating out more and off plan, he has had an increase in cravings and hunger.  Wants to eat more bread, sweets lately.  Cannot get Wegovy or Ozempic.  Serum creatinine 1.24 4 months ago, it is stable.   2. Hypertension, essential Poorly controlled.  Patient took phentermine recently.  Normally blood pressure is 110's/70's. He is asymptomatic, no issues. No concerns this morning.    3. Vitamin D deficiency [name] is tolerating medication(s) well without side effects.  Medication compliance is good as patient endorses taking it as prescribed.  The patient denies additional concerns regarding this condition.       Assessment/Plan:  No orders of the defined types were placed in this encounter.   Medications Discontinued During This Encounter  Medication Reason   Vitamin D, Ergocalciferol, (DRISDOL) 1.25 MG (50000 UNIT) CAPS capsule Reorder     Meds ordered this encounter  Medications   Vitamin D, Ergocalciferol, (DRISDOL) 1.25 MG (50000 UNIT) CAPS capsule    Sig: Take 1 capsule (50,000 Units total) by mouth every 7 (seven) days.    Dispense:  8 capsule    Refill:  0   metFORMIN (GLUCOPHAGE) 500 MG tablet    Sig:  Take 1 tablet (500 mg total) by mouth daily with lunch.    Dispense:  30 tablet    Refill:  0    30 d supply;  ** OV for RF **   Do not send RF request     1. Prediabetes Start metformin 1/2 tablet at lunch daily.  Start - metFORMIN (GLUCOPHAGE) 500 MG tablet; Take 1 tablet (500 mg total) by mouth daily with lunch.  Dispense: 30 tablet; Refill: 0  2. Hypertension, essential Continue blood pressure medications and stop phentermine.   3. Vitamin D deficiency - I again reiterated the importance of vitamin D (as well as calcium) to their health and wellbeing.  - I reviewed possible symptoms of low Vitamin D:  low energy, depressed mood, muscle aches, joint aches, osteoporosis etc. - low Vitamin D levels may be linked to an increased risk of cardiovascular events and even increased risk of cancers- such as colon and breast.  - ideal vitamin D levels reviewed with patient  - I recommend pt take a 50,000 IU weekly prescription vit D - see script below   - Informed patient this may be a lifelong thing, and he was encouraged to continue to take the medicine until told otherwise.    - weight loss will likely improve availability of vitamin D, thus encouraged [name] to continue with meal plan and their weight loss efforts to further improve this condition.  Thus, we will  need to monitor levels regularly (every 3-4 mo on average) to keep levels within normal limits and prevent over supplementation. - pt's questions and concerns regarding this condition addressed.   Refill - Vitamin D, Ergocalciferol, (DRISDOL) 1.25 MG (50000 UNIT) CAPS capsule; Take 1 capsule (50,000 Units total) by mouth every 7 (seven) days.  Dispense: 8 capsule; Refill: 0  4. Obesity, current BMI 35.1 [name] is currently in the action stage of change. As such, his goal is to continue with weight loss efforts. He has agreed to the Category 4 Plan.   Exercise goals: All adults should avoid inactivity. Some physical activity is better  than none, and adults who participate in any amount of physical activity gain some health benefits, and to help with stress.   Behavioral modification strategies: increasing lean protein intake, decreasing simple carbohydrates, decreasing eating out, avoiding temptations, and planning for success.  [name] has agreed to follow-up with our clinic in 4 weeks. He was informed of the importance of frequent follow-up visits to maximize his success with intensive lifestyle modifications for his multiple health conditions.   Objective:   Blood pressure (!) 142/88, pulse 75, temperature 99.1 F (37.3 C), height 5\' 10"  (1.778 m), weight 244 lb 12.8 oz (111 kg), SpO2 98 %. Body mass index is 35.13 kg/m.  General: Cooperative, alert, well developed, in no acute distress. HEENT: Conjunctivae and lids unremarkable. Cardiovascular: Regular rhythm.  Lungs: Normal work of breathing. Neurologic: No focal deficits.   Lab Results  Component Value Date   CREATININE 1.24 10/22/2021   BUN 20 10/22/2021   NA 140 10/22/2021   K 3.8 10/22/2021   CL 101 10/22/2021   CO2 25 10/22/2021   Lab Results  Component Value Date   ALT 35 10/22/2021   AST 28 10/22/2021   ALKPHOS 57 10/22/2021   BILITOT 0.4 10/22/2021   Lab Results  Component Value Date   HGBA1C 5.6 10/22/2021   HGBA1C 5.6 06/07/2021   HGBA1C 5.7 (H) 12/09/2020   HGBA1C 5.8 (H) 04/20/2020   Lab Results  Component Value Date   INSULIN 6.3 11/03/2021   INSULIN 8.4 06/07/2021   INSULIN 10.3 04/20/2020   Lab Results  Component Value Date   TSH 0.844 10/22/2021   Lab Results  Component Value Date   CHOL 143 10/22/2021   HDL 41 10/22/2021   LDLCALC 93 10/22/2021   TRIG 38 10/22/2021   CHOLHDL 3.5 10/22/2021   Lab Results  Component Value Date   VD25OH 46.2 11/03/2021   VD25OH 53.1 06/07/2021   VD25OH 43.3 12/09/2020   Lab Results  Component Value Date   WBC 4.0 10/22/2021   HGB 14.6 10/22/2021   HCT 44.7 10/22/2021   MCV 81  10/22/2021   PLT 269 10/22/2021   No results found for: "IRON", "TIBC", "FERRITIN"  Attestation Statements:   Reviewed by clinician on day of visit: allergies, medications, problem list, medical history, surgical history, family history, social history, and previous encounter notes.  Time spent on visit including pre-visit chart review and post-visit care and charting was 40 minutes.   I, 12/23/2021, RMA, am acting as Malcolm Metro for Energy manager, DO.   I have reviewed the above documentation for accuracy and completeness, and I agree with the above. [employer], D.O.  The 21st Century Cures Act was signed into law in 2016 which includes the topic of electronic health records.  This provides immediate access to information in MyChart.  This includes consultation notes, operative notes,  office notes, lab results and pathology reports.  If you have any questions about what you read please let us know at your next visit so we can discuss your concerns and take corrective action if need be.  We are right here with you.

## 2022-03-28 ENCOUNTER — Other Ambulatory Visit: Payer: Self-pay

## 2022-03-28 ENCOUNTER — Ambulatory Visit (INDEPENDENT_AMBULATORY_CARE_PROVIDER_SITE_OTHER): Payer: BC Managed Care – PPO | Admitting: Family Medicine

## 2022-03-28 ENCOUNTER — Encounter (INDEPENDENT_AMBULATORY_CARE_PROVIDER_SITE_OTHER): Payer: Self-pay | Admitting: Family Medicine

## 2022-03-28 VITALS — BP 146/84 | HR 68 | Temp 98.7°F | Ht 70.0 in | Wt 250.0 lb

## 2022-03-28 DIAGNOSIS — R7303 Prediabetes: Secondary | ICD-10-CM

## 2022-03-28 DIAGNOSIS — Z6836 Body mass index (BMI) 36.0-36.9, adult: Secondary | ICD-10-CM

## 2022-03-28 DIAGNOSIS — R632 Polyphagia: Secondary | ICD-10-CM

## 2022-03-28 DIAGNOSIS — I10 Essential (primary) hypertension: Secondary | ICD-10-CM

## 2022-03-28 DIAGNOSIS — E559 Vitamin D deficiency, unspecified: Secondary | ICD-10-CM | POA: Diagnosis not present

## 2022-03-28 DIAGNOSIS — E669 Obesity, unspecified: Secondary | ICD-10-CM

## 2022-03-28 MED ORDER — TOPIRAMATE 25 MG PO TABS
25.0000 mg | ORAL_TABLET | Freq: Two times a day (BID) | ORAL | 0 refills | Status: DC
  Filled 2022-03-28: qty 60, 30d supply, fill #0

## 2022-04-04 NOTE — Progress Notes (Signed)
Chief Complaint:   OBESITY Sean Kennedy is here to discuss his progress with his obesity treatment plan along with follow-up of his obesity related diagnoses. Sean Kennedy is on the Category 4 Plan and states he is following his eating plan approximately 40% of the time. Sean Kennedy states he is now exercising.  Today's visit was #: 26 Starting weight: 267 LBS Starting date: 04/20/2020 Today's weight: 250 LBS Today's date: 03/28/2022 Total lbs lost to date: 17 LBS Total lbs lost since last in-office visit: +6 LBS  Interim History: Patient went on a cruise around Thanksgiving.  Sleep has been around.  Sometimes skips breakfast.  Has coffee with creamer in the morning.  Working home stress has kept him from work outs.  Hunger and cravings are worse at night.  Subjective:   1. Prediabetes Patient started metformin 500 mg half tab with GI upset.  He was previously on Mounjaro.  It worked well, but no longer covered by his insurance.  Patient has been consuming more starches and sweets.  Last A1c was 5.6 on 11/01/2021.  2. Vitamin D deficiency Patient is on prescription vitamin D 50,000 IU weekly.  Last vitamin D level was 46.2 on 11/03/2021.  3. Essential hypertension Blood pressure elevated today in clinic.  He takes chlorthalidone 25 mg daily, losartan 100 mg daily.  Blood pressures at home running 110s/60s.  (03/25/2022) had increased blood pressure on phentermine.  4. Polyphagia Worsened by lack of morning protein intake and increased refined carbohydrate intake.  Assessment/Plan:   1. Prediabetes Discontinue metformin due to GI intolerance.  Reduce intake of added sugars.  2. Vitamin D deficiency Patient is due for a vitamin D level checked in January 2024.  3. Essential hypertension Avoid use of stimulants.  Monitor resting blood pressures at home 2 times a week.  4. Polyphagia Increase protein intake and increase fiber intake.  Begin- topiramate (TOPAMAX) 25 MG tablet; Take 1 tablet  (25 mg total) by mouth 2 (two) times daily.  Dispense: 60 tablet; Refill: 0  5. Obesity,current BMI 36.0 1.  Reviewed net progress: .  17 pounds in 23 months. 2.  Add a fair life protein shake in place with skipping breakfast. 3.  Consider Zepbound once available.   Sean Kennedy is currently in the action stage of change. As such, his goal is to continue with weight loss efforts. He has agreed to the Category 4 Plan.   Exercise goals:  Patient has plans to restart at the gym.  Behavioral modification strategies: increasing lean protein intake, increasing vegetables, increasing water intake, decreasing eating out, no skipping meals, meal planning and cooking strategies, keeping healthy foods in the home, planning for success, and decreasing junk food.  Sean Kennedy has agreed to follow-up with our clinic in 4 weeks. He was informed of the importance of frequent follow-up visits to maximize his success with intensive lifestyle modifications for his multiple health conditions.   Objective:   Blood pressure (!) 146/84, pulse 68, temperature 98.7 F (37.1 C), height 5\' 10"  (1.778 m), weight 250 lb (113.4 kg), SpO2 100 %. Body mass index is 35.87 kg/m.  General: Cooperative, alert, well developed, in no acute distress. HEENT: Conjunctivae and lids unremarkable. Cardiovascular: Regular rhythm.  Lungs: Normal work of breathing. Neurologic: No focal deficits.   Lab Results  Component Value Date   CREATININE 1.24 10/22/2021   BUN 20 10/22/2021   NA 140 10/22/2021   K 3.8 10/22/2021   CL 101 10/22/2021   CO2 25 10/22/2021  Lab Results  Component Value Date   ALT 35 10/22/2021   AST 28 10/22/2021   ALKPHOS 57 10/22/2021   BILITOT 0.4 10/22/2021   Lab Results  Component Value Date   HGBA1C 5.6 10/22/2021   HGBA1C 5.6 06/07/2021   HGBA1C 5.7 (H) 12/09/2020   HGBA1C 5.8 (H) 04/20/2020   Lab Results  Component Value Date   INSULIN 6.3 11/03/2021   INSULIN 8.4 06/07/2021   INSULIN 10.3  04/20/2020   Lab Results  Component Value Date   TSH 0.844 10/22/2021   Lab Results  Component Value Date   CHOL 143 10/22/2021   HDL 41 10/22/2021   LDLCALC 93 10/22/2021   TRIG 38 10/22/2021   CHOLHDL 3.5 10/22/2021   Lab Results  Component Value Date   VD25OH 46.2 11/03/2021   VD25OH 53.1 06/07/2021   VD25OH 43.3 12/09/2020   Lab Results  Component Value Date   WBC 4.0 10/22/2021   HGB 14.6 10/22/2021   HCT 44.7 10/22/2021   MCV 81 10/22/2021   PLT 269 10/22/2021   No results found for: "IRON", "TIBC", "FERRITIN"  Attestation Statements:   Reviewed by clinician on day of visit: allergies, medications, problem list, medical history, surgical history, family history, social history, and previous encounter notes.  I, Malcolm Metro, am acting as Energy manager for Seymour Bars, DO.  I have reviewed the above documentation for accuracy and completeness, and I agree with the above. Glennis Brink, DO

## 2022-04-27 ENCOUNTER — Telehealth: Payer: Self-pay | Admitting: Podiatry

## 2022-04-27 NOTE — Telephone Encounter (Addendum)
DOS: 05/27/2022  BCBS Effective 04/18/2022  [facility] Bunionectomy Lt [phone]) [facility] [phone]) [facility] [phone])  Deductible: $5,500 with $0 met Out-of-Pocket: $5,500 with $0 met CoInsurance: 0%  Authorization #: [id number] Authorization Valid: 05/27/2022 - 07/25/2022

## 2022-04-28 ENCOUNTER — Ambulatory Visit (INDEPENDENT_AMBULATORY_CARE_PROVIDER_SITE_OTHER): Payer: BC Managed Care – PPO | Admitting: Adult Health

## 2022-04-28 ENCOUNTER — Encounter (INDEPENDENT_AMBULATORY_CARE_PROVIDER_SITE_OTHER): Payer: Self-pay | Admitting: Adult Health

## 2022-04-28 ENCOUNTER — Other Ambulatory Visit: Payer: Self-pay

## 2022-04-28 VITALS — BP 113/77 | HR 74 | Temp 98.7°F | Ht 70.0 in | Wt 247.0 lb

## 2022-04-28 DIAGNOSIS — E559 Vitamin D deficiency, unspecified: Secondary | ICD-10-CM | POA: Diagnosis not present

## 2022-04-28 DIAGNOSIS — R7303 Prediabetes: Secondary | ICD-10-CM

## 2022-04-28 DIAGNOSIS — Z6835 Body mass index (BMI) 35.0-35.9, adult: Secondary | ICD-10-CM

## 2022-04-28 DIAGNOSIS — R632 Polyphagia: Secondary | ICD-10-CM

## 2022-04-28 DIAGNOSIS — E669 Obesity, unspecified: Secondary | ICD-10-CM | POA: Diagnosis not present

## 2022-04-28 MED ORDER — VITAMIN D (ERGOCALCIFEROL) 1.25 MG (50000 UNIT) PO CAPS
50000.0000 [IU] | ORAL_CAPSULE | ORAL | 0 refills | Status: DC
  Filled 2022-04-28 – 2022-05-27 (×2): qty 8, 56d supply, fill #0

## 2022-04-29 LAB — VITAMIN D 25 HYDROXY (VIT D DEFICIENCY, FRACTURES): Vit D, 25-Hydroxy: 42.4 ng/mL (ref 30.0–100.0)

## 2022-04-29 LAB — HEMOGLOBIN A1C
Est. average glucose Bld gHb Est-mCnc: 123 mg/dL
Hgb A1c MFr Bld: 5.9 % — ABNORMAL HIGH (ref 4.8–5.6)

## 2022-05-05 NOTE — Progress Notes (Signed)
Chief Complaint:   OBESITY Sean Kennedy is here to discuss his progress with his obesity treatment plan along with follow-up of his obesity related diagnoses. Sean Kennedy is on the Category 4 Plan and states he is following his eating plan approximately 60% of the time. Sean Kennedy states he is not exercising.  Today's visit was #: 66 Starting weight: 59 LBS Starting date: 04/20/2020 Today's weight: 247 LBS Today's date: 04/28/2022 Total lbs lost to date: 20 LBS Total lbs lost since last in-office visit: 3 LBS  Interim History:  His wife continues to slowly recover from a complicated plastic surgery procedure she underwent in [place] May 2023. Her wound vac was removed from her abdominal incision. She continues to experience recurrent abscesses around R buttock surgical site.  Health Goal 2024 1) Increase daily protein intake 2) Increase daily water intake to min 2-3L/day  Subjective:   1. Vitamin D deficiency Patient is on weekly ergocalciferol- denies N/V/Muscle Weakness. 11/03/21 Vit  D Level- 43.2- below goal  2. Polyphagia On 03/28/2022, patient started on Topamax 25 mg twice daily after 1 week of therapy he developed-mental fog, bilateral subconjunctival hemorrhage (no change in vision). He stopped Topamax 25mg  BID about 3 weeks ago.  3. Prediabetes Lab Results  Component Value Date   HGBA1C 5.9 (H) 04/28/2022   HGBA1C 5.6 10/22/2021   HGBA1C 5.6 06/07/2021    Patient prefers not to take metformin 500 mg daily, has been off for about 5 weeks.  Assessment/Plan:   1. Vitamin D deficiency Check labs today.  - VITAMIN D 25 Hydroxy (Vit-D Deficiency, Fractures)  Refill- Vitamin D, Ergocalciferol, (DRISDOL) 1.25 MG (50000 UNIT) CAPS capsule; Take 1 capsule (50,000 Units total) by mouth every 7 (seven) days.  Dispense: 8 capsule; Refill: 0  2. Polyphagia Remain off Topamax. Increase daily water and protein intake.  3. Prediabetes Check labs today.   Remain off metformin.  -  Hemoglobin A1c  4. Obesity, current BMI 35.5 Sean Kennedy is currently in the action stage of change. As such, his goal is to continue with weight loss efforts. He has agreed to the Category 4 Plan.   Exercise goals:  As is.  Behavioral modification strategies: increasing lean protein intake, decreasing simple carbohydrates, meal planning and cooking strategies, keeping healthy foods in the home, and planning for success.  Sean Kennedy has agreed to follow-up with our clinic in 3 weeks. He was informed of the importance of frequent follow-up visits to maximize his success with intensive lifestyle modifications for his multiple health conditions.   Objective:   Blood pressure 113/77, pulse 74, temperature 98.7 F (37.1 C), height 5\' 10"  (1.778 m), weight 247 lb (112 kg), SpO2 97 %. Body mass index is 35.44 kg/m.  General: Cooperative, alert, well developed, in no acute distress. HEENT: Conjunctivae and lids unremarkable. Cardiovascular: Regular rhythm.  Lungs: Normal work of breathing. Neurologic: No focal deficits.   Lab Results  Component Value Date   CREATININE 1.24 10/22/2021   BUN 20 10/22/2021   NA 140 10/22/2021   K 3.8 10/22/2021   CL 101 10/22/2021   CO2 25 10/22/2021   Lab Results  Component Value Date   ALT 35 10/22/2021   AST 28 10/22/2021   ALKPHOS 57 10/22/2021   BILITOT 0.4 10/22/2021   Lab Results  Component Value Date   HGBA1C 5.9 (H) 04/28/2022   HGBA1C 5.6 10/22/2021   HGBA1C 5.6 06/07/2021   HGBA1C 5.7 (H) 12/09/2020   HGBA1C 5.8 (H) 04/20/2020  Lab Results  Component Value Date   INSULIN 6.3 11/03/2021   INSULIN 8.4 06/07/2021   INSULIN 10.3 04/20/2020   Lab Results  Component Value Date   TSH 0.844 10/22/2021   Lab Results  Component Value Date   CHOL 143 10/22/2021   HDL 41 10/22/2021   LDLCALC 93 10/22/2021   TRIG 38 10/22/2021   CHOLHDL 3.5 10/22/2021   Lab Results  Component Value Date   VD25OH 42.4 04/28/2022   VD25OH 46.2 11/03/2021    VD25OH 53.1 06/07/2021   Lab Results  Component Value Date   WBC 4.0 10/22/2021   HGB 14.6 10/22/2021   HCT 44.7 10/22/2021   MCV 81 10/22/2021   PLT 269 10/22/2021   No results found for: "IRON", "TIBC", "FERRITIN"  Attestation Statements:   Reviewed by clinician on day of visit: allergies, medications, problem list, medical history, surgical history, family history, social history, and previous encounter notes.  I, Davy Pique, RMA, am acting as Location manager for Mina Marble, NP.  I have reviewed the above documentation for accuracy and completeness, and I agree with the above. -  Jentzen Minasyan d. Starr Engel, NP-C

## 2022-05-09 ENCOUNTER — Other Ambulatory Visit: Payer: Self-pay

## 2022-05-09 ENCOUNTER — Ambulatory Visit (INDEPENDENT_AMBULATORY_CARE_PROVIDER_SITE_OTHER): Payer: BC Managed Care – PPO | Admitting: Family Medicine

## 2022-05-09 ENCOUNTER — Encounter: Payer: Self-pay | Admitting: Family Medicine

## 2022-05-09 VITALS — BP 132/80 | HR 74 | Temp 98.5°F | Resp 16 | Wt 260.0 lb

## 2022-05-09 DIAGNOSIS — Z6837 Body mass index (BMI) 37.0-37.9, adult: Secondary | ICD-10-CM

## 2022-05-09 DIAGNOSIS — R7303 Prediabetes: Secondary | ICD-10-CM | POA: Diagnosis not present

## 2022-05-09 DIAGNOSIS — F411 Generalized anxiety disorder: Secondary | ICD-10-CM

## 2022-05-09 DIAGNOSIS — I10 Essential (primary) hypertension: Secondary | ICD-10-CM

## 2022-05-09 MED ORDER — CHLORTHALIDONE 25 MG PO TABS
25.0000 mg | ORAL_TABLET | Freq: Every day | ORAL | 3 refills | Status: DC
  Filled 2022-05-09 – 2022-05-27 (×2): qty 90, 90d supply, fill #0

## 2022-05-09 MED ORDER — LOSARTAN POTASSIUM 100 MG PO TABS
100.0000 mg | ORAL_TABLET | Freq: Every day | ORAL | 3 refills | Status: DC
  Filled 2022-05-09 – 2022-05-27 (×2): qty 90, 90d supply, fill #0

## 2022-05-09 NOTE — Progress Notes (Signed)
I,Sulibeya S Dimas,acting as a scribe for [name], MD.,have documented all relevant documentation on the behalf of [name], MD,as directed by  [name], MD while in the presence of [name], MD.     Established patient visit   Patient: Sean Kennedy   DOB: Jan 05, 1981   42 y.o. Male  MRN: [id number] Visit Date: 05/09/2022  Today's healthcare provider: [name], MD   Chief Complaint  Patient presents with   Hypertension   Subjective    HPI  Depression/Anxiety, Follow-up  He  was last seen for this 4 months ago. Changes made at last visit include continue bupropion.   He reports  D/C months ago .  He reports poor tolerance of treatment. He feels he is Improved since last visit.     05/09/2022    9:44 AM 03/01/2022    9:02 AM 12/23/2021    3:54 PM  Depression screen PHQ 2/9  Decreased Interest 0 0 0  Down, Depressed, Hopeless 0 0 0  PHQ - 2 Score 0 0 0  Altered sleeping 0 1 2  Tired, decreased energy 0 1 1  Change in appetite 0 0 0  Feeling bad or failure about yourself  0 0 0  Trouble concentrating 0 0 0  Moving slowly or fidgety/restless 0 0 0  Suicidal thoughts 0 0 0  PHQ-9 Score 0 2 3  Difficult doing work/chores Not difficult at all Not difficult at all Not difficult at all    --------------------------------------------------------------------------------------- Hypertension, follow-up  BP Readings from Last 3 Encounters:  05/09/22 132/80  04/28/22 113/77  03/28/22 (!) 146/84   Wt Readings from Last 3 Encounters:  05/09/22 260 lb (117.9 kg)  04/28/22 247 lb (112 kg)  03/28/22 250 lb (113.4 kg)     He was last seen for hypertension 6 months ago.  BP at that visit was 121/82. Management since that visit includes no changes.  He reports excellent compliance with treatment. He is not having side effects.   Use of agents associated with hypertension: none.   Outside blood pressures are stable  117/80s.  Pertinent labs Lab Results  Component Value Date   CHOL 143 10/22/2021   HDL 41 10/22/2021   LDLCALC 93 10/22/2021   TRIG 38 10/22/2021   CHOLHDL 3.5 10/22/2021   Lab Results  Component Value Date   NA 140 10/22/2021   K 3.8 10/22/2021   CREATININE 1.24 10/22/2021   EGFR 75 10/22/2021   GLUCOSE 78 10/22/2021   TSH 0.844 10/22/2021     The 10-year ASCVD risk score (Arnett DK, et al., 2019) is: 5.6%  ---------------------------------------------------------------------------------------------------   Medications: Outpatient Medications Prior to Visit  Medication Sig   meloxicam (MOBIC) 15 MG tablet Take 1 tablet (15 mg total) by mouth daily. (Patient taking differently: Take 15 mg by mouth as needed.)   Vitamin D, Ergocalciferol, (DRISDOL) 1.25 MG (50000 UNIT) CAPS capsule Take 1 capsule (50,000 Units total) by mouth every 7 (seven) days.   [DISCONTINUED] chlorthalidone (HYGROTON) 25 MG tablet Take 1 tablet (25 mg total) by mouth daily.   [DISCONTINUED] losartan (COZAAR) 100 MG tablet Take 1 tablet (100 mg total) by mouth daily.   No facility-administered medications prior to visit.    Review of Systems  Constitutional:  Negative for appetite change and fatigue.  Respiratory:  Negative for chest tightness and shortness of breath.   Cardiovascular:  Negative for chest pain and leg swelling.       Objective  BP 132/80 (BP Location: Left Arm, Patient Position: Sitting, Cuff Size: Large)   Pulse 74   Temp 98.5 F (36.9 C) (Temporal)   Resp 16   Wt 260 lb (117.9 kg)   BMI 37.31 kg/m  BP Readings from Last 3 Encounters:  05/09/22 132/80  04/28/22 113/77  03/28/22 (!) 146/84   Wt Readings from Last 3 Encounters:  05/09/22 260 lb (117.9 kg)  04/28/22 247 lb (112 kg)  03/28/22 250 lb (113.4 kg)      Physical Exam Vitals reviewed.  Constitutional:      General: He is not in acute distress.    Appearance: Normal appearance. He is not diaphoretic.   HENT:     Head: Normocephalic and atraumatic.  Eyes:     General: No scleral icterus.    Conjunctiva/sclera: Conjunctivae normal.  Cardiovascular:     Rate and Rhythm: Normal rate and regular rhythm.     Pulses: Normal pulses.     Heart sounds: Normal heart sounds. No murmur heard. Pulmonary:     Effort: Pulmonary effort is normal. No respiratory distress.     Breath sounds: Normal breath sounds. No wheezing or rhonchi.  Musculoskeletal:     Cervical back: Neck supple.     Right lower leg: No edema.     Left lower leg: No edema.  Lymphadenopathy:     Cervical: No cervical adenopathy.  Skin:    General: Skin is warm and dry.     Findings: No rash.  Neurological:     Mental Status: He is alert and oriented to person, place, and time. Mental status is at baseline.  Psychiatric:        Mood and Affect: Mood normal.        Behavior: Behavior normal.       No results found for any visits on 05/09/22.  Assessment & Plan     Problem List Items Addressed This Visit       Cardiovascular and Mediastinum   Essential hypertension - Primary    Well controlled Continue current medications Recheck metabolic panel F/u in 6 months       Relevant Medications   chlorthalidone (HYGROTON) 25 MG tablet   losartan (COZAAR) 100 MG tablet   Other Relevant Orders   Basic Metabolic Panel (BMET)     Other   Obesity    F/b [employer] Discussed importance of healthy weight management Discussed diet and exercise       Pre-diabetes    Recommend low carb diet Recent A1c stable      GAD (generalized anxiety disorder)    Chronic and stable Not currently on medications        Return in about 6 months (around 11/07/2022) for CPE.      I, [name], MD, have reviewed all documentation for this visit. The documentation on 05/09/22 for the exam, diagnosis, procedures, and orders are all accurate and complete.   Taraji Mungo, Dionne Bucy, MD, MPH [facility]

## 2022-05-09 NOTE — Assessment & Plan Note (Signed)
Chronic and stable Not currently on medications

## 2022-05-09 NOTE — Assessment & Plan Note (Signed)
F/b [employer] Discussed importance of healthy weight management Discussed diet and exercise

## 2022-05-09 NOTE — Assessment & Plan Note (Signed)
Well controlled Continue current medications Recheck metabolic panel F/u in 6 months  

## 2022-05-09 NOTE — Assessment & Plan Note (Signed)
Recommend low carb diet Recent A1c stable

## 2022-05-10 ENCOUNTER — Telehealth: Payer: Self-pay

## 2022-05-10 DIAGNOSIS — I10 Essential (primary) hypertension: Secondary | ICD-10-CM

## 2022-05-10 LAB — BASIC METABOLIC PANEL
BUN/Creatinine Ratio: 9 (ref 9–20)
BUN: 12 mg/dL (ref 6–24)
CO2: 26 mmol/L (ref 20–29)
Calcium: 9.4 mg/dL (ref 8.7–10.2)
Chloride: 97 mmol/L (ref 96–106)
Creatinine, Ser: 1.41 mg/dL — ABNORMAL HIGH (ref 0.76–1.27)
Glucose: 112 mg/dL — ABNORMAL HIGH (ref 70–99)
Potassium: 3.6 mmol/L (ref 3.5–5.2)
Sodium: 137 mmol/L (ref 134–144)
eGFR: 64 mL/min/{1.73_m2} (ref >59)

## 2022-05-10 NOTE — Telephone Encounter (Signed)
Patient advised of lab results

## 2022-05-10 NOTE — Telephone Encounter (Signed)
-----  Message from Virginia Crews, MD sent at 05/10/2022  7:51 AM EST ----- Slight worsening of kidney function. Make sure to hydrate well and avoid NSAIDs. Recheck in 1-2 weeks.

## 2022-05-13 ENCOUNTER — Other Ambulatory Visit: Payer: Self-pay

## 2022-05-18 ENCOUNTER — Ambulatory Visit (INDEPENDENT_AMBULATORY_CARE_PROVIDER_SITE_OTHER): Payer: BC Managed Care – PPO | Admitting: Adult Health

## 2022-05-27 ENCOUNTER — Other Ambulatory Visit: Payer: Self-pay

## 2022-05-27 ENCOUNTER — Other Ambulatory Visit: Payer: Self-pay | Admitting: Podiatry

## 2022-05-27 DIAGNOSIS — G8918 Other acute postprocedural pain: Secondary | ICD-10-CM | POA: Diagnosis not present

## 2022-05-27 DIAGNOSIS — M2012 Hallux valgus (acquired), left foot: Secondary | ICD-10-CM | POA: Diagnosis not present

## 2022-05-27 DIAGNOSIS — M89772 Major osseous defect, left ankle and foot: Secondary | ICD-10-CM | POA: Diagnosis not present

## 2022-05-27 DIAGNOSIS — M2042 Other hammer toe(s) (acquired), left foot: Secondary | ICD-10-CM | POA: Diagnosis not present

## 2022-05-27 DIAGNOSIS — M21612 Bunion of left foot: Secondary | ICD-10-CM | POA: Diagnosis not present

## 2022-05-27 DIAGNOSIS — M205X2 Other deformities of toe(s) (acquired), left foot: Secondary | ICD-10-CM | POA: Diagnosis not present

## 2022-05-27 MED ORDER — IBUPROFEN 600 MG PO TABS
600.0000 mg | ORAL_TABLET | Freq: Four times a day (QID) | ORAL | 0 refills | Status: AC | PRN
  Filled 2022-05-27: qty 56, 14d supply, fill #0

## 2022-05-27 MED ORDER — OXYCODONE HCL 5 MG PO TABS
5.0000 mg | ORAL_TABLET | ORAL | 0 refills | Status: DC | PRN
  Filled 2022-05-27: qty 20, 4d supply, fill #0

## 2022-05-27 MED ORDER — GABAPENTIN 300 MG PO CAPS
300.0000 mg | ORAL_CAPSULE | Freq: Three times a day (TID) | ORAL | 0 refills | Status: DC
  Filled 2022-05-27: qty 21, 7d supply, fill #0

## 2022-05-27 MED ORDER — ACETAMINOPHEN 500 MG PO TABS
1000.0000 mg | ORAL_TABLET | Freq: Four times a day (QID) | ORAL | 0 refills | Status: AC | PRN
  Filled 2022-05-27: qty 112, 14d supply, fill #0

## 2022-05-27 NOTE — Progress Notes (Signed)
05/27/22 left foot bunionectomy

## 2022-05-30 ENCOUNTER — Other Ambulatory Visit: Payer: Self-pay

## 2022-05-30 ENCOUNTER — Other Ambulatory Visit: Payer: Self-pay | Admitting: Podiatry

## 2022-05-31 ENCOUNTER — Other Ambulatory Visit: Payer: Self-pay

## 2022-06-01 ENCOUNTER — Other Ambulatory Visit: Payer: Self-pay

## 2022-06-01 ENCOUNTER — Encounter: Payer: Self-pay | Admitting: Podiatry

## 2022-06-01 ENCOUNTER — Ambulatory Visit (INDEPENDENT_AMBULATORY_CARE_PROVIDER_SITE_OTHER): Payer: BC Managed Care – PPO | Admitting: Podiatry

## 2022-06-01 ENCOUNTER — Ambulatory Visit (INDEPENDENT_AMBULATORY_CARE_PROVIDER_SITE_OTHER): Payer: BC Managed Care – PPO

## 2022-06-01 DIAGNOSIS — M21612 Bunion of left foot: Secondary | ICD-10-CM | POA: Diagnosis not present

## 2022-06-01 DIAGNOSIS — M2012 Hallux valgus (acquired), left foot: Secondary | ICD-10-CM

## 2022-06-01 DIAGNOSIS — Z9889 Other specified postprocedural states: Secondary | ICD-10-CM | POA: Diagnosis not present

## 2022-06-01 MED ORDER — GABAPENTIN 300 MG PO CAPS
300.0000 mg | ORAL_CAPSULE | Freq: Three times a day (TID) | ORAL | 0 refills | Status: DC
  Filled 2022-06-01: qty 21, 7d supply, fill #0

## 2022-06-01 MED ORDER — OXYCODONE HCL 5 MG PO TABS
5.0000 mg | ORAL_TABLET | ORAL | 0 refills | Status: AC | PRN
  Filled 2022-06-01: qty 20, 4d supply, fill #0

## 2022-06-01 NOTE — Progress Notes (Signed)
  Subjective:  Patient ID: Sean Kennedy, male    DOB: 1981-03-04,  MRN: [id number]  Chief Complaint  Patient presents with   Routine Post Op    "It's been hurting a lot.  He requested pain medicine on MyChart but he didn't hear anything back."    DOS: 05/27/2022 Procedure: Lapidus bunionectomy and Akin, left foot  42 y.o. male returns for post-op check.  Overall doing okay having quite a bit of pain  Review of Systems: Negative except as noted in the HPI. Denies N/V/F/Ch.   Objective:  There were no vitals filed for this visit. There is no height or weight on file to calculate BMI. Constitutional Well developed. Well nourished.  Vascular Foot warm and well perfused. Capillary refill normal to all digits.  Calf is soft and supple, no posterior calf or knee pain, negative Homans' sign  Neurologic Normal speech. Oriented to person, place, and time. Epicritic sensation to light touch grossly present bilaterally.  Dermatologic Skin healing well without signs of infection. Skin edges well coapted without signs of infection.  Orthopedic: Tenderness to palpation noted about the surgical site.  Moderate edema   Multiple view plain film radiographs: Good correction noted, hardware intact and in good position Assessment:   1. Hallux valgus with bunions, left    Plan:  Patient was evaluated and treated and all questions answered.  S/p foot surgery left -Progressing as expected post-operatively. -XR: Noted above no complications -WB Status: NWB in cam walker boot with knee scooter, boot was dispensed today -Sutures: Return in 12 days for removal. -Medications: Refilled gabapentin and oxycodone sent to [place] redressed.  May remove on Monday and begin bathing and showering -No spacer dispensed he will begin using this Monday as well  No follow-ups on file.

## 2022-06-02 ENCOUNTER — Encounter: Payer: Self-pay | Admitting: Podiatry

## 2022-06-02 ENCOUNTER — Encounter: Payer: BC Managed Care – PPO | Admitting: Podiatry

## 2022-06-13 ENCOUNTER — Encounter: Payer: Self-pay | Admitting: Podiatry

## 2022-06-13 ENCOUNTER — Ambulatory Visit (INDEPENDENT_AMBULATORY_CARE_PROVIDER_SITE_OTHER): Payer: BC Managed Care – PPO | Admitting: Podiatry

## 2022-06-13 ENCOUNTER — Other Ambulatory Visit: Payer: Self-pay

## 2022-06-13 DIAGNOSIS — M21612 Bunion of left foot: Secondary | ICD-10-CM

## 2022-06-13 DIAGNOSIS — M2012 Hallux valgus (acquired), left foot: Secondary | ICD-10-CM

## 2022-06-13 MED ORDER — GABAPENTIN 300 MG PO CAPS
300.0000 mg | ORAL_CAPSULE | Freq: Three times a day (TID) | ORAL | 1 refills | Status: DC
  Filled 2022-06-13: qty 21, 7d supply, fill #0

## 2022-06-13 NOTE — Progress Notes (Signed)
  Subjective:  Patient ID: Sean Kennedy, male    DOB: October 29, 1980,  MRN: [id number]  Chief Complaint  Patient presents with   Routine Post Op    "I am getting a real sharp pain, it's like a surge.  When I take a shower, it hurts like crazy."  "He needs a refill on the Gabapentin."    DOS: 05/27/2022 Procedure: Lapidus bunionectomy and Akin, left foot  42 y.o. male returns for post-op check.  Overall doing okay pain is improving he still having some sharp shooting pains around the incision into the toe  Review of Systems: Negative except as noted in the HPI. Denies N/V/F/Ch.   Objective:  There were no vitals filed for this visit. There is no height or weight on file to calculate BMI. Constitutional Well developed. Well nourished.  Vascular Foot warm and well perfused. Capillary refill normal to all digits.  Calf is soft and supple, no posterior calf or knee pain, negative Homans' sign  Neurologic Normal speech. Oriented to person, place, and time. Epicritic sensation to light touch grossly present bilaterally.  Dermatologic Incision is well-healed.  Organized healing blood blister on dorsal foot  Orthopedic: Mild edema.  Mild tenderness around fusion site   Multiple view plain film radiographs: Good correction noted, hardware intact and in good position Assessment:   1. Hallux valgus with bunions, left     Plan:  Patient was evaluated and treated and all questions answered.  S/p foot surgery left -Doing well sutures were removed uneventfully today.  Compression sleeve applied he will continue to wear this.  May begin WBAT in the cam boot.  Return in 3 weeks for follow-up new x-rays and plan to transition to weightbearing in a surgical shoe at that point.  Refill of gabapentin sent to pharmacy.  May bathe regularly and apply lotion and scar gel  Return in about 3 weeks (around 07/04/2022) for post op (new x-rays).

## 2022-06-15 ENCOUNTER — Encounter: Payer: BC Managed Care – PPO | Admitting: Podiatry

## 2022-06-16 ENCOUNTER — Encounter: Payer: BC Managed Care – PPO | Admitting: Podiatry

## 2022-06-30 ENCOUNTER — Encounter: Payer: Self-pay | Admitting: Family Medicine

## 2022-06-30 ENCOUNTER — Other Ambulatory Visit: Payer: Self-pay

## 2022-06-30 ENCOUNTER — Ambulatory Visit (INDEPENDENT_AMBULATORY_CARE_PROVIDER_SITE_OTHER): Payer: BC Managed Care – PPO | Admitting: Family Medicine

## 2022-06-30 VITALS — BP 147/100 | HR 82 | Resp 16 | Wt 255.4 lb

## 2022-06-30 DIAGNOSIS — H1031 Unspecified acute conjunctivitis, right eye: Secondary | ICD-10-CM

## 2022-06-30 DIAGNOSIS — I10 Essential (primary) hypertension: Secondary | ICD-10-CM | POA: Diagnosis not present

## 2022-06-30 MED ORDER — POLYMYXIN B-TRIMETHOPRIM 10000-0.1 UNIT/ML-% OP SOLN
2.0000 [drp] | Freq: Four times a day (QID) | OPHTHALMIC | 0 refills | Status: AC
  Filled 2022-06-30: qty 10, 25d supply, fill #0

## 2022-06-30 NOTE — Progress Notes (Signed)
I,[name],acting as a scribe for Sean Paganini, MD.,have documented all relevant documentation on the behalf of Sean Paganini, MD,as directed by  Sean Paganini, MD while in the presence of Sean Paganini, MD.   Established patient visit   Patient: Sean Kennedy   DOB: 1980/10/27   42 y.o. Male  MRN: [id number] Visit Date: 06/30/2022  Today's healthcare provider: [name], MD   Chief Complaint  Patient presents with   Conjunctivitis   Subjective    Conjunctivitis  The current episode started 2 days ago. The onset was sudden. The problem has been gradually improving. Nothing relieves the symptoms. Nothing aggravates the symptoms. Associated symptoms include eye redness. Pertinent negatives include no fever, no decreased vision, no double vision, no ear discharge, no ear pain, no headaches, no rhinorrhea, no sore throat, no cough, no URI, no eye discharge and no eye pain. The right eye is affected. The eye pain is not associated with movement. The eyelid exhibits redness. There were no sick contacts. He has received no recent medical care.      Medications: Outpatient Medications Prior to Visit  Medication Sig   meloxicam (MOBIC) 15 MG tablet Take 1 tablet (15 mg total) by mouth daily. (Patient taking differently: Take 15 mg by mouth as needed.)   Vitamin D, Ergocalciferol, (DRISDOL) 1.25 MG (50000 UNIT) CAPS capsule Take 1 capsule (50,000 Units total) by mouth every 7 (seven) days.   chlorthalidone (HYGROTON) 25 MG tablet Take 1 tablet (25 mg total) by mouth daily. (Patient not taking: Reported on 06/30/2022)   gabapentin (NEURONTIN) 300 MG capsule Take 1 capsule (300 mg total) by mouth 3 (three) times daily for 14 days.   losartan (COZAAR) 100 MG tablet Take 1 tablet (100 mg total) by mouth daily. (Patient not taking: Reported on 06/30/2022)   No facility-administered medications prior to visit.    Review of Systems  Constitutional:  Negative for  fever.  HENT:  Negative for ear discharge, ear pain, rhinorrhea and sore throat.   Eyes:  Positive for redness. Negative for double vision, pain and discharge.  Respiratory:  Negative for cough.   Neurological:  Negative for headaches.       Objective    BP (!) 147/100 (BP Location: Left Arm, Patient Position: Sitting, Cuff Size: Large) Comment: not taking blood pressure medication  Pulse 82   Resp 16   Wt 255 lb 6.4 oz (115.8 kg)   BMI 36.65 kg/m    Physical Exam Constitutional:      General: He is not in acute distress.    Appearance: Normal appearance. He is not diaphoretic.  HENT:     Head: Normocephalic.  Eyes:     General: Lids are normal. Vision grossly intact.        Right eye: No foreign body or discharge.     Extraocular Movements: Extraocular movements intact.     Right eye: Normal extraocular motion.     Left eye: Normal extraocular motion.     Conjunctiva/sclera:     Right eye: Right conjunctiva is injected.  Pulmonary:     Effort: Pulmonary effort is normal. No respiratory distress.  Neurological:     Mental Status: He is alert and oriented to person, place, and time. Mental status is at baseline.       No results found for any visits on 06/30/22.  Assessment & Plan     1. Essential hypertension - uncontrolled - has not taken medications for  last several months - will resume medications, watch home BP, and return in 1 month for recheck  2. Acute conjunctivitis of right eye, unspecified acute conjunctivitis type - new problem - no red flags - not a contact wearer - treat with polytrim drops x5d - return precautions discussed   Return in about 4 weeks (around 07/28/2022) for BP f/u.      I, Sean Paganini, MD, have reviewed all documentation for this visit. The documentation on 06/30/22 for the exam, diagnosis, procedures, and orders are all accurate and complete.   Anthany Thornhill, Dionne Bucy, MD, MPH [facility]

## 2022-07-04 ENCOUNTER — Ambulatory Visit (INDEPENDENT_AMBULATORY_CARE_PROVIDER_SITE_OTHER): Payer: BC Managed Care – PPO

## 2022-07-04 ENCOUNTER — Ambulatory Visit (INDEPENDENT_AMBULATORY_CARE_PROVIDER_SITE_OTHER): Payer: BC Managed Care – PPO | Admitting: Podiatry

## 2022-07-04 DIAGNOSIS — M21612 Bunion of left foot: Secondary | ICD-10-CM | POA: Diagnosis not present

## 2022-07-04 DIAGNOSIS — M2012 Hallux valgus (acquired), left foot: Secondary | ICD-10-CM | POA: Diagnosis not present

## 2022-07-04 NOTE — Progress Notes (Signed)
  Subjective:  Patient ID: Sean Kennedy, male    DOB: [birth year],  MRN: [id number]  Chief Complaint  Patient presents with   Routine Post Op    POV # 3 DOS 05/27/22 --- BUNION CORRRECTION LEFT FOOT WITH JOINT FUSION POSSIBLE BONE CUT IN BIG TOE, BONE GRAFT FROM HEEL    DOS: 05/27/2022 Procedure: Lapidus bunionectomy and Akin, left foot  42 y.o. male returns for post-op check.  Doing well he still has some burning tingling shooting pain into the toe especially medially  Review of Systems: Negative except as noted in the HPI. Denies N/V/F/Ch.   Objective:  There were no vitals filed for this visit. There is no height or weight on file to calculate BMI. Constitutional Well developed. Well nourished.  Vascular Foot warm and well perfused. Capillary refill normal to all digits.  Calf is soft and supple, no posterior calf or knee pain, negative Homans' sign  Neurologic Normal speech. Oriented to person, place, and time. Epicritic sensation to light touch grossly present bilaterally.  Dermatologic Central area of delayed healing with scab no signs of infection or drainage  Orthopedic: Mild edema.  Pain improved   Multiple view plain film radiographs: Good early consolidation across fusion site noted Assessment:   1. Hallux valgus with bunions, left     Plan:  Patient was evaluated and treated and all questions answered.  S/p foot surgery left -Pain is improving and good early healing noted on his x-rays.  I think we can transition away from the cam walker boot to a supportive postop shoe.  We will do this for 3 weeks and then he may return to regular shoe gear as tolerated.  Does have some delayed skin healing happening and they can use Neosporin on this and leave open to air primarily.  No signs of infection here do not see indication for oral antibiotics currently.  May continue regular bathing.  Discussed with him still to limit significant activity on the foot, such as lawn work but  regular ambulating short distances is okay  Return in about 4 weeks (around 08/01/2022) for post op (new x-rays).

## 2022-07-06 ENCOUNTER — Encounter: Payer: BC Managed Care – PPO | Admitting: Podiatry

## 2022-07-07 ENCOUNTER — Encounter: Payer: BC Managed Care – PPO | Admitting: Podiatry

## 2022-07-14 ENCOUNTER — Other Ambulatory Visit: Payer: Self-pay

## 2022-07-29 NOTE — Progress Notes (Unsigned)
I,Nikeisha Klutz S Nikiya Starn,acting as a scribe for Shirlee Latch, MD.,have documented all relevant documentation on the behalf of Shirlee Latch, MD,as directed by  Shirlee Latch, MD while in the presence of Shirlee Latch, MD.   Established patient visit   Patient: Sean Kennedy   DOB: 01-31-1981   42 y.o. Male  MRN: [id number] Visit Date: 08/01/2022  Today's healthcare provider: Shirlee Latch, MD   Chief Complaint  Patient presents with   Hypertension   Subjective    HPI  Hypertension, follow-up  BP Readings from Last 3 Encounters:  08/01/22 121/83  06/30/22 (!) 147/100  05/09/22 132/80   Wt Readings from Last 3 Encounters:  08/01/22 263 lb (119.3 kg)  06/30/22 255 lb 6.4 oz (115.8 kg)  05/09/22 260 lb (117.9 kg)     He was last seen for hypertension 3 months ago.  BP at that visit was 132/80. Management since that visit includes no changes.  He reports excellent compliance with treatment. He is not having side effects.   Use of agents associated with hypertension: none.   Outside blood pressures are not being checked.  Pertinent labs Lab Results  Component Value Date   CHOL 143 10/22/2021   HDL 41 10/22/2021   LDLCALC 93 10/22/2021   TRIG 38 10/22/2021   CHOLHDL 3.5 10/22/2021   Lab Results  Component Value Date   NA 137 05/09/2022   K 3.6 05/09/2022   CREATININE 1.41 (H) 05/09/2022   EGFR 64 05/09/2022   GLUCOSE 112 (H) 05/09/2022   TSH 0.844 10/22/2021     The 10-year ASCVD risk score (Arnett DK, et al., 2019) is: 4.7%  ---------------------------------------------------------------------------------------------------   Medications: Outpatient Medications Prior to Visit  Medication Sig   meloxicam (MOBIC) 15 MG tablet Take 1 tablet (15 mg total) by mouth daily. (Patient taking differently: Take 15 mg by mouth as needed.)   [DISCONTINUED] chlorthalidone (HYGROTON) 25 MG tablet Take 1 tablet (25 mg total) by mouth daily.    [DISCONTINUED] losartan (COZAAR) 100 MG tablet Take 1 tablet (100 mg total) by mouth daily.   [DISCONTINUED] Vitamin D, Ergocalciferol, (DRISDOL) 1.25 MG (50000 UNIT) CAPS capsule Take 1 capsule (50,000 Units total) by mouth every 7 (seven) days.   gabapentin (NEURONTIN) 300 MG capsule Take 1 capsule (300 mg total) by mouth 3 (three) times daily for 14 days.   No facility-administered medications prior to visit.    Review of Systems  Constitutional:  Negative for appetite change and fatigue.  Eyes:  Positive for visual disturbance.  Respiratory:  Negative for cough, chest tightness and shortness of breath.   Cardiovascular:  Negative for chest pain and leg swelling.  Neurological:  Negative for dizziness, light-headedness and headaches.       Objective    BP 121/83 (BP Location: Left Arm, Patient Position: Sitting, Cuff Size: Large)   Pulse 84   Temp 98.3 F (36.8 C) (Temporal)   Resp 12   Wt 263 lb (119.3 kg)   BMI 37.74 kg/m    Physical Exam Vitals reviewed.  Constitutional:      General: He is not in acute distress.    Appearance: Normal appearance. He is not diaphoretic.  HENT:     Head: Normocephalic and atraumatic.  Eyes:     General: No scleral icterus.    Conjunctiva/sclera: Conjunctivae normal.  Cardiovascular:     Rate and Rhythm: Normal rate and regular rhythm.     Pulses: Normal pulses.  Heart sounds: Normal heart sounds. No murmur heard. Pulmonary:     Effort: Pulmonary effort is normal. No respiratory distress.     Breath sounds: Normal breath sounds. No wheezing or rhonchi.  Musculoskeletal:     Cervical back: Neck supple.     Right lower leg: No edema.     Left lower leg: No edema.  Lymphadenopathy:     Cervical: No cervical adenopathy.  Skin:    General: Skin is warm and dry.     Findings: No rash.  Neurological:     Mental Status: He is alert and oriented to person, place, and time. Mental status is at baseline.  Psychiatric:        Mood  and Affect: Mood normal.        Behavior: Behavior normal.       No results found for any visits on 08/01/22.  Assessment & Plan     Problem List Items Addressed This Visit       Cardiovascular and Mediastinum   Essential hypertension - Primary    Well controlled Continue current medications Reviewed metabolic panel F/u in 3 months       Relevant Medications   chlorthalidone (HYGROTON) 25 MG tablet   losartan (COZAAR) 100 MG tablet     Other   Vitamin D deficiency   Relevant Medications   Vitamin D, Ergocalciferol, (DRISDOL) 1.25 MG (50000 UNIT) CAPS capsule     Return in about 3 months (around 10/31/2022) for CPE.      I, Shirlee Latch, MD, have reviewed all documentation for this visit. The documentation on 08/01/22 for the exam, diagnosis, procedures, and orders are all accurate and complete.   Bacigalupo, Marzella Schlein, MD, MPH [facility]

## 2022-08-01 ENCOUNTER — Ambulatory Visit (INDEPENDENT_AMBULATORY_CARE_PROVIDER_SITE_OTHER): Payer: BC Managed Care – PPO

## 2022-08-01 ENCOUNTER — Ambulatory Visit (INDEPENDENT_AMBULATORY_CARE_PROVIDER_SITE_OTHER): Payer: BC Managed Care – PPO | Admitting: Podiatry

## 2022-08-01 ENCOUNTER — Encounter: Payer: Self-pay | Admitting: Podiatry

## 2022-08-01 ENCOUNTER — Encounter: Payer: Self-pay | Admitting: Family Medicine

## 2022-08-01 ENCOUNTER — Ambulatory Visit (INDEPENDENT_AMBULATORY_CARE_PROVIDER_SITE_OTHER): Payer: BC Managed Care – PPO | Admitting: Family Medicine

## 2022-08-01 ENCOUNTER — Other Ambulatory Visit: Payer: Self-pay

## 2022-08-01 VITALS — BP 121/83 | HR 84 | Temp 98.3°F | Resp 12 | Wt 263.0 lb

## 2022-08-01 DIAGNOSIS — E559 Vitamin D deficiency, unspecified: Secondary | ICD-10-CM | POA: Diagnosis not present

## 2022-08-01 DIAGNOSIS — I10 Essential (primary) hypertension: Secondary | ICD-10-CM | POA: Diagnosis not present

## 2022-08-01 DIAGNOSIS — M2012 Hallux valgus (acquired), left foot: Secondary | ICD-10-CM

## 2022-08-01 DIAGNOSIS — Z9889 Other specified postprocedural states: Secondary | ICD-10-CM

## 2022-08-01 DIAGNOSIS — M21612 Bunion of left foot: Secondary | ICD-10-CM

## 2022-08-01 MED ORDER — LOSARTAN POTASSIUM 100 MG PO TABS
100.0000 mg | ORAL_TABLET | Freq: Every day | ORAL | 3 refills | Status: DC
  Filled 2022-08-01: qty 90, 90d supply, fill #0

## 2022-08-01 MED ORDER — IBUPROFEN 600 MG PO TABS
600.0000 mg | ORAL_TABLET | Freq: Four times a day (QID) | ORAL | 0 refills | Status: AC | PRN
  Filled 2022-08-01: qty 56, 14d supply, fill #0

## 2022-08-01 MED ORDER — CHLORTHALIDONE 25 MG PO TABS
25.0000 mg | ORAL_TABLET | Freq: Every day | ORAL | 3 refills | Status: DC
  Filled 2022-08-01: qty 90, 90d supply, fill #0

## 2022-08-01 MED ORDER — VITAMIN D (ERGOCALCIFEROL) 1.25 MG (50000 UNIT) PO CAPS
50000.0000 [IU] | ORAL_CAPSULE | ORAL | 0 refills | Status: DC
  Filled 2022-08-01: qty 12, 84d supply, fill #0
  Filled 2022-08-01: qty 1, 7d supply, fill #0

## 2022-08-01 NOTE — Progress Notes (Signed)
  Subjective:  Patient ID: Sean Kennedy, male    DOB: [birth year],  MRN: [id number]  Chief Complaint  Patient presents with   Routine Post Op    "It's doing good, still some swelling."    DOS: 05/27/2022 Procedure: Lapidus bunionectomy and Akin, left foot  42 y.o. male returns for post-op check.  Overall doing well pain is much better he is back in regular shoes  Review of Systems: Negative except as noted in the HPI. Denies N/V/F/Ch.   Objective:  There were no vitals filed for this visit. There is no height or weight on file to calculate BMI. Constitutional Well developed. Well nourished.  Vascular Foot warm and well perfused. Capillary refill normal to all digits.  Calf is soft and supple, no posterior calf or knee pain, negative Homans' sign  Neurologic Normal speech. Oriented to person, place, and time. Epicritic sensation to light touch grossly present bilaterally.  Dermatologic Small area of scab still but wound appears to be healed, no drainage  Orthopedic: Mild edema.  Pain improved   Multiple view plain film radiographs: Correction is maintained he has good fusion across the arthrodesis site and 90% healing across the Akin osteotomy Assessment:   1. Status post surgery   2. Hallux valgus with bunions, left     Plan:  Patient was evaluated and treated and all questions answered.  S/p foot surgery left -Doing well he can resume regular shoe gear and essentially full activity, I would gradually increase weightbearing and exercise before proceeding with impact activity which can happen around 4 months after surgery.  I will see him back in 4 months for follow-up.  Ibuprofen refill sent to use as needed  Return in about 4 months (around 12/01/2022) for surgery follow up (new xrays).

## 2022-08-01 NOTE — Assessment & Plan Note (Signed)
Well controlled Continue current medications Reviewed metabolic panel F/u in 3 months  

## 2022-10-31 ENCOUNTER — Ambulatory Visit (INDEPENDENT_AMBULATORY_CARE_PROVIDER_SITE_OTHER): Payer: BC Managed Care – PPO | Admitting: Family Medicine

## 2022-10-31 ENCOUNTER — Other Ambulatory Visit: Payer: Self-pay

## 2022-10-31 ENCOUNTER — Encounter: Payer: Self-pay | Admitting: Family Medicine

## 2022-10-31 VITALS — BP 119/81 | HR 80 | Temp 98.7°F | Resp 14 | Ht 70.0 in | Wt 266.2 lb

## 2022-10-31 DIAGNOSIS — R7303 Prediabetes: Secondary | ICD-10-CM | POA: Diagnosis not present

## 2022-10-31 DIAGNOSIS — E559 Vitamin D deficiency, unspecified: Secondary | ICD-10-CM | POA: Diagnosis not present

## 2022-10-31 DIAGNOSIS — Z0000 Encounter for general adult medical examination without abnormal findings: Secondary | ICD-10-CM | POA: Diagnosis not present

## 2022-10-31 DIAGNOSIS — I10 Essential (primary) hypertension: Secondary | ICD-10-CM

## 2022-10-31 DIAGNOSIS — R5383 Other fatigue: Secondary | ICD-10-CM

## 2022-10-31 DIAGNOSIS — Z6837 Body mass index (BMI) 37.0-37.9, adult: Secondary | ICD-10-CM

## 2022-10-31 MED ORDER — LOSARTAN POTASSIUM 100 MG PO TABS
100.0000 mg | ORAL_TABLET | Freq: Every day | ORAL | 3 refills | Status: DC
  Filled 2022-10-31: qty 90, 90d supply, fill #0

## 2022-10-31 MED ORDER — CHLORTHALIDONE 25 MG PO TABS
25.0000 mg | ORAL_TABLET | Freq: Every day | ORAL | 3 refills | Status: DC
Start: 2022-10-31 — End: 2023-05-04
  Filled 2022-10-31: qty 90, 90d supply, fill #0

## 2022-10-31 NOTE — Assessment & Plan Note (Signed)
 Recommend low carb diet °Recheck A1c  °

## 2022-10-31 NOTE — Progress Notes (Signed)
Complete physical exam  Patient: Sean Kennedy   DOB: 1980/05/12   42 y.o. Male  MRN: [id number]  Subjective:    Chief Complaint  Patient presents with   Annual Exam    Sean Kennedy is a 42 y.o. male who presents today for a complete physical exam. He reports consuming a general diet. The patient does not participate in regular exercise at present. He generally feels well. He reports sleeping fairly well. He does have additional problems to discuss today.   Discussed the use of AI scribe software for clinical note transcription with the patient, who gave verbal consent to proceed.  History of Present Illness   The patient presents for a physical exam and reports a long-standing issue of low energy and fatigue. They describe waking up feeling unrested despite sleeping well. They deny snoring or any known sleep apnea symptoms. They have recently gained weight following a period of inactivity due to surgery and have started a new job. They also report difficulty remembering to take their once-weekly vitamin D supplement, which has been prescribed in the past for low levels.        Most recent fall risk assessment:    05/09/2022    9:44 AM  Fall Risk   Falls in the past year? 0  Number falls in past yr: 0  Injury with Fall? 0  Risk for fall due to : No Fall Risks  Follow up Falls evaluation completed     Most recent depression screenings:    10/31/2022    8:46 AM 05/09/2022    9:44 AM  PHQ 2/9 Scores  PHQ - 2 Score 0 0  PHQ- 9 Score  0        Patient Care Team: Erasmo Downer, MD as PCP - General (Family Medicine)   Outpatient Medications Prior to Visit  Medication Sig   Vitamin D, Ergocalciferol, (DRISDOL) 1.25 MG (50000 UNIT) CAPS capsule Take 1 capsule (50,000 Units total) by mouth every 7 (seven) days.   [DISCONTINUED] chlorthalidone (HYGROTON) 25 MG tablet Take 1 tablet (25 mg total) by mouth daily.   [DISCONTINUED] losartan (COZAAR) 100 MG tablet Take 1  tablet (100 mg total) by mouth daily.   [DISCONTINUED] gabapentin (NEURONTIN) 300 MG capsule Take 1 capsule (300 mg total) by mouth 3 (three) times daily for 14 days.   [DISCONTINUED] meloxicam (MOBIC) 15 MG tablet Take 1 tablet (15 mg total) by mouth daily. (Patient not taking: Reported on 10/31/2022)   No facility-administered medications prior to visit.    ROS per HPI     Objective:     BP 119/81 (BP Location: Left Arm, Patient Position: Sitting, Cuff Size: Large)   Pulse 80   Temp 98.7 F (37.1 C) (Oral)   Resp 14   Ht 5\' 10"  (1.778 m)   Wt 266 lb 3.2 oz (120.7 kg)   SpO2 94%   BMI 38.20 kg/m    Physical Exam Vitals reviewed.  Constitutional:      General: He is not in acute distress.    Appearance: Normal appearance. He is well-developed. He is not diaphoretic.  HENT:     Head: Normocephalic and atraumatic.     Right Ear: Tympanic membrane, ear canal and external ear normal.     Left Ear: Tympanic membrane, ear canal and external ear normal.     Nose: Nose normal.     Mouth/Throat:     Mouth: Mucous membranes are moist.  Pharynx: Oropharynx is clear. No oropharyngeal exudate.  Eyes:     General: No scleral icterus.    Conjunctiva/sclera: Conjunctivae normal.     Pupils: Pupils are equal, round, and reactive to light.  Neck:     Thyroid: No thyromegaly.  Cardiovascular:     Rate and Rhythm: Normal rate and regular rhythm.     Pulses: Normal pulses.     Heart sounds: Normal heart sounds. No murmur heard. Pulmonary:     Effort: Pulmonary effort is normal. No respiratory distress.     Breath sounds: Normal breath sounds. No wheezing or rales.  Abdominal:     General: There is no distension.     Palpations: Abdomen is soft.     Tenderness: There is no abdominal tenderness.  Musculoskeletal:        General: No deformity.     Cervical back: Neck supple.     Right lower leg: No edema.     Left lower leg: No edema.  Lymphadenopathy:     Cervical: No cervical  adenopathy.  Skin:    General: Skin is warm and dry.     Findings: No rash.  Neurological:     Mental Status: He is alert and oriented to person, place, and time. Mental status is at baseline.     Gait: Gait normal.  Psychiatric:        Mood and Affect: Mood normal.        Behavior: Behavior normal.        Thought Content: Thought content normal.      No results found for any visits on 10/31/22.     Assessment & Plan:    Routine Health Maintenance and Physical Exam  Immunization History  Administered Date(s) Administered   PFIZER(Purple Top)SARS-COV-2 Vaccination 07/06/2019, 07/30/2019, 01/30/2020   Tdap 03/11/2019    Health Maintenance  Topic Date Due   COVID-19 Vaccine (4 - 2023-24 season) 12/17/2021   INFLUENZA VACCINE  11/17/2022   DTaP/Tdap/Td (2 - Td or Tdap) 03/10/2029   Hepatitis C Screening  Completed   HIV Screening  Completed   HPV VACCINES  Aged Out    Discussed health benefits of physical activity, and encouraged him to engage in regular exercise appropriate for his age and condition.  Problem List Items Addressed This Visit       Cardiovascular and Mediastinum   Essential hypertension    Blood pressure well-controlled on current regimen. -Continue Chlorthalidone 25mg  daily and Losartan 100mg  daily. -Send refills to pharmacy.      Relevant Medications   chlorthalidone (HYGROTON) 25 MG tablet   losartan (COZAAR) 100 MG tablet   Other Relevant Orders   Comprehensive metabolic panel   Lipid panel     Other   Obesity    Discussed importance of healthy weight management Discussed diet and exercise       Relevant Orders   Comprehensive metabolic panel   Lipid panel   Pre-diabetes    Recommend low carb diet Recheck A1c       Relevant Orders   Hemoglobin A1c   Vitamin D deficiency   Relevant Orders   VITAMIN D 25 Hydroxy (Vit-D Deficiency, Fractures)   Other Visit Diagnoses     Encounter for annual physical exam    -  Primary    Relevant Orders   Hemoglobin A1c   Comprehensive metabolic panel   Lipid panel   VITAMIN D 25 Hydroxy (Vit-D Deficiency, Fractures)   CBC with Differential/Platelet  Vitamin B12   TSH   Fatigue, unspecified type       Relevant Orders   VITAMIN D 25 Hydroxy (Vit-D Deficiency, Fractures)   CBC with Differential/Platelet   Vitamin B12   TSH          Fatigue: Chronic fatigue with unrefreshing sleep. No snoring or other symptoms suggestive of sleep apnea. History of low Vitamin D. -Order labs including CBC, CMP, TSH, and Vitamin D levels. -Consider sleep study if labs are normal and symptoms persist.  Weight Gain: Recent weight gain of approximately 10 pounds following surgery and lifestyle changes. -Encourage regular exercise, considering indoor or water-based activities due to heat.  General Health Maintenance: -Plan for flu shot and COVID booster in the fall. -Next colon cancer screening due at age 21. -Next tetanus shot due in 2030. -Follow-up in 6 months for routine blood pressure check, or sooner if needed.        Return in about 6 months (around 05/03/2023) for chronic disease f/u.     Shirlee Latch, MD

## 2022-10-31 NOTE — Assessment & Plan Note (Signed)
 Discussed importance of healthy weight management Discussed diet and exercise  

## 2022-10-31 NOTE — Assessment & Plan Note (Signed)
Blood pressure well-controlled on current regimen. -Continue Chlorthalidone 25mg  daily and Losartan 100mg  daily. -Send refills to pharmacy.

## 2022-11-01 LAB — COMPREHENSIVE METABOLIC PANEL
ALT: 34 IU/L (ref 0–44)
AST: 38 IU/L (ref 0–40)
Albumin: 4.3 g/dL (ref 4.1–5.1)
Alkaline Phosphatase: 70 IU/L (ref 44–121)
BUN/Creatinine Ratio: 12 (ref 9–20)
BUN: 14 mg/dL (ref 6–24)
Bilirubin Total: 0.4 mg/dL (ref 0.0–1.2)
CO2: 24 mmol/L (ref 20–29)
Calcium: 9.3 mg/dL (ref 8.7–10.2)
Chloride: 102 mmol/L (ref 96–106)
Creatinine, Ser: 1.21 mg/dL (ref 0.76–1.27)
Globulin, Total: 2.4 g/dL (ref 1.5–4.5)
Glucose: 89 mg/dL (ref 70–99)
Potassium: 3.5 mmol/L (ref 3.5–5.2)
Sodium: 140 mmol/L (ref 134–144)
Total Protein: 6.7 g/dL (ref 6.0–8.5)
eGFR: 77 mL/min/{1.73_m2} (ref >59)

## 2022-11-01 LAB — VITAMIN B12: Vitamin B-12: 628 pg/mL (ref 232–1245)

## 2022-11-01 LAB — CBC WITH DIFFERENTIAL/PLATELET
Basophils Absolute: 0 10*3/uL (ref 0.0–0.2)
Basos: 1 %
EOS (ABSOLUTE): 0.1 10*3/uL (ref 0.0–0.4)
Eos: 1 %
Hematocrit: 45.4 % (ref 37.5–51.0)
Hemoglobin: 14.6 g/dL (ref 13.0–17.7)
Immature Grans (Abs): 0 10*3/uL (ref 0.0–0.1)
Immature Granulocytes: 0 %
Lymphocytes Absolute: 1.3 10*3/uL (ref 0.7–3.1)
Lymphs: 31 %
MCH: 26.2 pg — ABNORMAL LOW (ref 26.6–33.0)
MCHC: 32.2 g/dL (ref 31.5–35.7)
MCV: 81 fL (ref 79–97)
Monocytes Absolute: 0.4 10*3/uL (ref 0.1–0.9)
Monocytes: 9 %
Neutrophils Absolute: 2.4 10*3/uL (ref 1.4–7.0)
Neutrophils: 58 %
Platelets: 225 10*3/uL (ref 150–450)
RBC: 5.58 x10E6/uL (ref 4.14–5.80)
RDW: 14.2 % (ref 11.6–15.4)
WBC: 4.2 10*3/uL (ref 3.4–10.8)

## 2022-11-01 LAB — LIPID PANEL
Chol/HDL Ratio: 3.9 ratio (ref 0.0–5.0)
Cholesterol, Total: 165 mg/dL (ref 100–199)
HDL: 42 mg/dL (ref >39)
LDL Chol Calc (NIH): 110 mg/dL — ABNORMAL HIGH (ref 0–99)
Triglycerides: 67 mg/dL (ref 0–149)
VLDL Cholesterol Cal: 13 mg/dL (ref 5–40)

## 2022-11-01 LAB — HEMOGLOBIN A1C
Est. average glucose Bld gHb Est-mCnc: 128 mg/dL
Hgb A1c MFr Bld: 6.1 % — ABNORMAL HIGH (ref 4.8–5.6)

## 2022-11-01 LAB — VITAMIN D 25 HYDROXY (VIT D DEFICIENCY, FRACTURES): Vit D, 25-Hydroxy: 52.4 ng/mL (ref 30.0–100.0)

## 2022-11-01 LAB — TSH: TSH: 0.87 u[IU]/mL (ref 0.450–4.500)

## 2022-11-09 ENCOUNTER — Ambulatory Visit (INDEPENDENT_AMBULATORY_CARE_PROVIDER_SITE_OTHER): Payer: BC Managed Care – PPO | Admitting: Podiatry

## 2022-11-09 ENCOUNTER — Ambulatory Visit (INDEPENDENT_AMBULATORY_CARE_PROVIDER_SITE_OTHER): Payer: BC Managed Care – PPO

## 2022-11-09 ENCOUNTER — Encounter: Payer: Self-pay | Admitting: Podiatry

## 2022-11-09 VITALS — BP 141/78 | HR 74

## 2022-11-09 DIAGNOSIS — M2012 Hallux valgus (acquired), left foot: Secondary | ICD-10-CM

## 2022-11-09 DIAGNOSIS — M960 Pseudarthrosis after fusion or arthrodesis: Secondary | ICD-10-CM | POA: Diagnosis not present

## 2022-11-09 DIAGNOSIS — M21612 Bunion of left foot: Secondary | ICD-10-CM

## 2022-11-09 DIAGNOSIS — Z9889 Other specified postprocedural states: Secondary | ICD-10-CM | POA: Diagnosis not present

## 2022-11-09 NOTE — Progress Notes (Signed)
  Subjective:  Patient ID: Sean Kennedy, male    DOB: 09-15-1980,  MRN: [id number]  Chief Complaint  Patient presents with   Routine Post Op    "It still hurts every now and then.  It hurts on the bottom and that bunion hurts sometime."    DOS: 05/27/2022 Procedure: Lapidus bunionectomy and Akin, left foot  42 y.o. male returns for post-op check.   Review of Systems: Negative except as noted in the HPI. Denies N/V/F/Ch.   Objective:   Vitals:   11/09/22 0830  BP: (!) 141/78  Pulse: 74   There is no height or weight on file to calculate BMI. Constitutional Well developed. Well nourished.  Vascular Foot warm and well perfused. Capillary refill normal to all digits.  Calf is soft and supple, no posterior calf or knee pain, negative Homans' sign  Neurologic Normal speech. Oriented to person, place, and time. Epicritic sensation to light touch grossly present bilaterally.  Dermatologic Incision with healed not hypertrophic  Orthopedic: He does have edema and some tenderness in the plantar first TMT   Multiple view plain film radiographs: New developed lucency in the first TMT fusion site Assessment:   1. Status post surgery   2. Hallux valgus with bunions, left   3. Pseudarthrosis after fusion or arthrodesis     Plan:  Patient was evaluated and treated and all questions answered.  S/p foot surgery left -We reviewed his radiographs he does have new lucency noted at his fusion site, there may be fracture of the intercuneiform screw that is unclear.  Given his symptoms I do suspect he has a delayed union although some bony union is present it is not the entirety of the joint.  I would like him to begin noninvasive bone stimulation.  Order for this was placed.  I will see him back in 3 months after this for new radiographs or sooner if he is having issues.   Return in about 3 months (around 02/09/2023) for surgery follow up ( new xrays).

## 2022-11-16 DIAGNOSIS — E8889 Other specified metabolic disorders: Secondary | ICD-10-CM | POA: Diagnosis not present

## 2022-11-16 DIAGNOSIS — I10 Essential (primary) hypertension: Secondary | ICD-10-CM | POA: Diagnosis not present

## 2022-11-16 DIAGNOSIS — R7303 Prediabetes: Secondary | ICD-10-CM | POA: Diagnosis not present

## 2022-11-16 DIAGNOSIS — E559 Vitamin D deficiency, unspecified: Secondary | ICD-10-CM | POA: Diagnosis not present

## 2022-11-16 DIAGNOSIS — F411 Generalized anxiety disorder: Secondary | ICD-10-CM | POA: Diagnosis not present

## 2022-12-06 DIAGNOSIS — H1032 Unspecified acute conjunctivitis, left eye: Secondary | ICD-10-CM | POA: Diagnosis not present

## 2022-12-15 ENCOUNTER — Other Ambulatory Visit: Payer: Self-pay

## 2022-12-15 DIAGNOSIS — R7303 Prediabetes: Secondary | ICD-10-CM | POA: Diagnosis not present

## 2022-12-15 DIAGNOSIS — I10 Essential (primary) hypertension: Secondary | ICD-10-CM | POA: Diagnosis not present

## 2022-12-15 DIAGNOSIS — R632 Polyphagia: Secondary | ICD-10-CM | POA: Diagnosis not present

## 2022-12-15 MED ORDER — PHENTERMINE HCL 37.5 MG PO TABS
37.5000 mg | ORAL_TABLET | Freq: Every day | ORAL | 0 refills | Status: DC
  Filled 2022-12-15: qty 30, 30d supply, fill #0

## 2022-12-27 ENCOUNTER — Other Ambulatory Visit: Payer: Self-pay

## 2023-02-08 ENCOUNTER — Encounter: Payer: Self-pay | Admitting: Podiatry

## 2023-02-08 ENCOUNTER — Ambulatory Visit (INDEPENDENT_AMBULATORY_CARE_PROVIDER_SITE_OTHER): Payer: BC Managed Care – PPO | Admitting: Podiatry

## 2023-02-08 ENCOUNTER — Ambulatory Visit (INDEPENDENT_AMBULATORY_CARE_PROVIDER_SITE_OTHER): Payer: BC Managed Care – PPO

## 2023-02-08 VITALS — BP 150/86 | HR 78

## 2023-02-08 DIAGNOSIS — M21612 Bunion of left foot: Secondary | ICD-10-CM

## 2023-02-08 DIAGNOSIS — I10 Essential (primary) hypertension: Secondary | ICD-10-CM | POA: Diagnosis not present

## 2023-02-08 DIAGNOSIS — R7303 Prediabetes: Secondary | ICD-10-CM | POA: Diagnosis not present

## 2023-02-08 DIAGNOSIS — Z9889 Other specified postprocedural states: Secondary | ICD-10-CM | POA: Diagnosis not present

## 2023-02-08 DIAGNOSIS — R632 Polyphagia: Secondary | ICD-10-CM | POA: Diagnosis not present

## 2023-02-08 DIAGNOSIS — M960 Pseudarthrosis after fusion or arthrodesis: Secondary | ICD-10-CM

## 2023-02-08 DIAGNOSIS — Z9189 Other specified personal risk factors, not elsewhere classified: Secondary | ICD-10-CM | POA: Diagnosis not present

## 2023-02-08 DIAGNOSIS — M2012 Hallux valgus (acquired), left foot: Secondary | ICD-10-CM | POA: Diagnosis not present

## 2023-02-08 NOTE — Progress Notes (Signed)
Subjective:  Patient ID: Sean Kennedy, male    DOB: [birth year],  MRN: [id number]  Chief Complaint  Patient presents with   Routine Post Op    DOS: 05/27/2022  Lapidus bunionectomy and Akin, left foot "It's doing good."     DOS: 05/27/2022 Procedure: Lapidus bunionectomy and Akin, left foot  42 y.o. male returns for post-op check.  He notes that it is feeling better he has been using the bone stimulator nightly  Review of Systems: Negative except as noted in the HPI. Denies N/V/F/Ch.   Objective:   Vitals:   02/08/23 0827  BP: (!) 150/86  Pulse: 78   There is no height or weight on file to calculate BMI. Constitutional Well developed. Well nourished.  Vascular Foot warm and well perfused. Capillary refill normal to all digits.  Calf is soft and supple, no posterior calf or knee pain, negative Homans' sign  Neurologic Normal speech. Oriented to person, place, and time. Epicritic sensation to light touch grossly present bilaterally.  Dermatologic Incision with healed not hypertrophic  Orthopedic: No edema pain or instability to manipulation of the first TMT joint   Multiple view plain film radiographs: Since last visit increasing consolidation of the fusion site is noted, he has no change in alignment or instability or hardware complication Assessment:   1. Status post surgery   2. Pseudarthrosis after fusion or arthrodesis   3. Hallux valgus with bunions, left     Plan:  Patient was evaluated and treated and all questions answered.  S/p foot surgery left -Bone stimulator showing some progress and consolidation across the joint.  He may continue activity in regular shoe gear as tolerated.  Continue using bone stimulator.  I will see him back in 3 months for final follow-up nearing 1 year mark from surgery   Return in about 3 months (around 05/11/2023) for surgery follow up (new left foot xrays).

## 2023-03-19 DIAGNOSIS — J029 Acute pharyngitis, unspecified: Secondary | ICD-10-CM | POA: Diagnosis not present

## 2023-04-03 DIAGNOSIS — Z1331 Encounter for screening for depression: Secondary | ICD-10-CM | POA: Diagnosis not present

## 2023-04-03 DIAGNOSIS — F411 Generalized anxiety disorder: Secondary | ICD-10-CM | POA: Diagnosis not present

## 2023-04-03 DIAGNOSIS — R7303 Prediabetes: Secondary | ICD-10-CM | POA: Diagnosis not present

## 2023-04-03 DIAGNOSIS — I10 Essential (primary) hypertension: Secondary | ICD-10-CM | POA: Diagnosis not present

## 2023-04-17 DIAGNOSIS — Z125 Encounter for screening for malignant neoplasm of prostate: Secondary | ICD-10-CM | POA: Diagnosis not present

## 2023-04-17 DIAGNOSIS — Z1329 Encounter for screening for other suspected endocrine disorder: Secondary | ICD-10-CM | POA: Diagnosis not present

## 2023-04-17 DIAGNOSIS — E291 Testicular hypofunction: Secondary | ICD-10-CM | POA: Diagnosis not present

## 2023-04-18 DIAGNOSIS — J029 Acute pharyngitis, unspecified: Secondary | ICD-10-CM | POA: Diagnosis not present

## 2023-04-20 ENCOUNTER — Ambulatory Visit: Payer: Self-pay

## 2023-04-20 ENCOUNTER — Ambulatory Visit: Payer: BC Managed Care – PPO | Admitting: Family Medicine

## 2023-04-20 VITALS — BP 134/89 | HR 74 | Temp 98.8°F | Ht 70.0 in | Wt 259.8 lb

## 2023-04-20 DIAGNOSIS — J029 Acute pharyngitis, unspecified: Secondary | ICD-10-CM | POA: Diagnosis not present

## 2023-04-20 DIAGNOSIS — Z20818 Contact with and (suspected) exposure to other bacterial communicable diseases: Secondary | ICD-10-CM

## 2023-04-20 LAB — POC COVID19/FLU A&B COMBO
Covid Antigen, POC: NEGATIVE
Influenza A Antigen, POC: NEGATIVE
Influenza B Antigen, POC: NEGATIVE

## 2023-04-20 LAB — POCT RAPID STREP A (OFFICE): Rapid Strep A Screen: NEGATIVE

## 2023-04-20 MED ORDER — AMOXICILLIN 500 MG PO CAPS: 500.0000 mg | ORAL_CAPSULE | Freq: Two times a day (BID) | ORAL | 0 refills | Status: AC

## 2023-04-20 NOTE — Progress Notes (Signed)
 Acute visit   Patient: Sean Kennedy   DOB: 1981-01-07   43 y.o. Male  MRN: [id number]  Chief Complaint  Patient presents with   Sore Throat    Associated with right earache, headache, productive cough with greenish phlegm, sinus congestion, runny nose, mouth redness X 4 days and worsening Frequency: constant Pertinent Negatives: Patient denies fever, chest pain, nausea, vomiting Patient stated his daughter is currently being treated for strep throat starting last Wednesday and he is around her all the time. Patient states it is painful to swallow. Patient has taken mucinex fast max, tylenol  pm last night, zyrtec started Tuesday.     Subjective    Discussed the use of AI scribe software for clinical note transcription with the patient, who gave verbal consent to proceed.  History of Present Illness   The patient, with a history of hypertension, presents with a sore throat and congestion that began approximately five days ago. Initially, they experienced neck pain, which they attributed to recent physical work at home. However, the pain progressed to swelling in the neck area. The patient's four-year-old daughter was recently diagnosed with strep throat, raising the possibility of transmission. The patient's symptoms have included nasal congestion, coughing with mucus production, and a possible mild fever. They have been managing symptoms with over-the-counter medications, including Mucinex Fast Max, Tylenol  PM, and Zyrtec, but report minimal relief.        Review of Systems  Objective    BP 134/89 (Cuff Size: Large)   Pulse 74   Temp 98.8 F (37.1 C) (Oral)   Ht 5' 10 (1.778 m)   Wt 259 lb 12.8 oz (117.8 kg)   BMI 37.28 kg/m  Physical Exam Vitals reviewed.  Constitutional:      General: He is not in acute distress.    Appearance: Normal appearance. He is not diaphoretic.  HENT:     Head: Normocephalic and atraumatic.     Right Ear: Tympanic membrane and ear canal  normal.     Left Ear: Tympanic membrane and ear canal normal.     Nose: Congestion present.     Mouth/Throat:     Mouth: Mucous membranes are moist. No oral lesions.     Pharynx: Posterior oropharyngeal erythema present. No oropharyngeal exudate.  Eyes:     General: No scleral icterus.    Conjunctiva/sclera: Conjunctivae normal.  Cardiovascular:     Rate and Rhythm: Normal rate and regular rhythm.     Pulses: Normal pulses.     Heart sounds: Normal heart sounds. No murmur heard. Pulmonary:     Effort: Pulmonary effort is normal. No respiratory distress.     Breath sounds: Normal breath sounds. No wheezing or rhonchi.  Musculoskeletal:     Cervical back: Neck supple.     Right lower leg: No edema.     Left lower leg: No edema.  Lymphadenopathy:     Cervical: No cervical adenopathy.  Skin:    General: Skin is warm and dry.     Findings: No rash.  Neurological:     Mental Status: He is alert and oriented to person, place, and time. Mental status is at baseline.  Psychiatric:        Mood and Affect: Mood normal.        Behavior: Behavior normal.       No results found for any visits on 04/20/23.  Assessment & Plan     Problem List Items Addressed This  Visit   None Visit Diagnoses       Sore throat    -  Primary   Relevant Orders   POCT rapid strep A   POC Covid19/Flu A&B Antigen   Culture, Group A Strep     Strep throat exposure       Relevant Orders   Culture, Group A Strep       Assessment and Plan    Viral Upper Respiratory Infection (URI) Symptoms include sore throat, nasal congestion, cough, and swollen salivary glands for five days, worsening since Tuesday. Physical exam shows mild throat redness without white plaques. Negative rapid tests for strep, flu, and COVID. Differential diagnosis favors viral URI over bacterial strep throat due to presence of cough and nasal congestion. Discussed that strep throat typically lacks these symptoms. Viral URI symptoms  generally resolve in 7-10 days without treatment. Discussed antibiotics for presumed strep due to daughter's recent diagnosis, noting antibiotics won't alleviate nasal congestion and are only effective if strep is present. - Continue symptomatic treatment with Mucinex and Flonase - Use throat spray and honey cough drops for sore throat - Use nasal spray at bedtime to reduce overnight drainage - Send throat culture to confirm strep diagnosis - Start amoxicillin  500 mg twice daily for 10 days, pending culture results - Avoid decongestants due to hypertension  Hypertension Hypertension contraindicates decongestants like Sudafed. Plain Mucinex and Flonase are safe alternatives for symptom relief. - Avoid decongestants - Use plain Mucinex and Flonase for symptom relief  Follow-up - Review throat culture results and adjust antibiotic treatment if necessary - Evaluate symptom resolution and overall health status at follow-up appointment on the 16th.         Meds ordered this encounter  Medications   amoxicillin  (AMOXIL ) 500 MG capsule    Sig: Take 1 capsule (500 mg total) by mouth 2 (two) times daily for 10 days.    Dispense:  20 capsule    Refill:  0     Return if symptoms worsen or fail to improve.      Jon Eva, MD  [facility] [phone] (phone) [phone] (fax)  [facility]

## 2023-04-20 NOTE — Telephone Encounter (Signed)
 Chief Complaint: Sore throat Symptoms: sore throat, right earache, headache, cough, congestion, runny nose, mouth redness Frequency: constant Pertinent Negatives: Patient denies fever, chest pain, nausea, vomiting Disposition: [] ED /[] Urgent Care (no appt availability in office) / [x] Appointment(In office/virtual)/ []  [facility]/ [] Home Care/ [] Refused Recommended Disposition /[]  Mobile Bus/ []  Follow-up with PCP Additional Notes: Patient states he has a sore throat that started over the weekend. Patient reports right earache, headache, cough, congestion and a runny nose. Patient stated his daughter is currently being treated for strep throat and he is around her all the time. Patient states it is painful to swallow. Care advice was given and patient has been scheduled for an appointment today at 1100 with PCP for evaluation.   Summary: sore throat   Pt has sore throat, cough, congestion and calling for appt today.  Daughter has strep, dx this week. Please advise     Reason for Disposition  Earache also present  Answer Assessment - Initial Assessment Questions 1. ONSET: When did the throat start hurting? (Hours or days ago)      Over the weekend 2. SEVERITY: How bad is the sore throat? (Scale 1-10; mild, moderate or severe)   - MILD (1-3):  Doesn't interfere with eating or normal activities.   - MODERATE (4-7): Interferes with eating some solids and normal activities.   - SEVERE (8-10):  Excruciating pain, interferes with most normal activities.   - SEVERE WITH DYSPHAGIA (10): Can't swallow liquids, drooling.     8/10 3. STREP EXPOSURE: Has there been any exposure to strep within the past week? If Yes, ask: What type of contact occurred?      Yes, my daughter I'm around her all the time  4.  VIRAL SYMPTOMS: Are there any symptoms of a cold, such as a runny nose, cough, hoarse voice or red eyes?      Cough, congestion,right earache, runny nose  5. FEVER:  Do you have a fever? If Yes, ask: What is your temperature, how was it measured, and when did it start?     I don't know  6. PUS ON THE TONSILS: Is there pus on the tonsils in the back of your throat?     I'm not sure I can't really see  7. OTHER SYMPTOMS: Do you have any other symptoms? (e.g., difficulty breathing, headache, rash)     Headache, redness in the mouth  Protocols used: Sore Throat-A-AH

## 2023-04-23 LAB — CULTURE, GROUP A STREP: Strep A Culture: NEGATIVE

## 2023-04-23 LAB — SPECIMEN STATUS REPORT

## 2023-04-25 DIAGNOSIS — G479 Sleep disorder, unspecified: Secondary | ICD-10-CM | POA: Diagnosis not present

## 2023-04-25 DIAGNOSIS — Z6836 Body mass index (BMI) 36.0-36.9, adult: Secondary | ICD-10-CM | POA: Diagnosis not present

## 2023-04-25 DIAGNOSIS — M2550 Pain in unspecified joint: Secondary | ICD-10-CM | POA: Diagnosis not present

## 2023-04-25 DIAGNOSIS — E291 Testicular hypofunction: Secondary | ICD-10-CM | POA: Diagnosis not present

## 2023-04-25 DIAGNOSIS — I10 Essential (primary) hypertension: Secondary | ICD-10-CM | POA: Diagnosis not present

## 2023-05-02 DIAGNOSIS — E291 Testicular hypofunction: Secondary | ICD-10-CM | POA: Diagnosis not present

## 2023-05-03 DIAGNOSIS — I10 Essential (primary) hypertension: Secondary | ICD-10-CM | POA: Diagnosis not present

## 2023-05-03 DIAGNOSIS — E559 Vitamin D deficiency, unspecified: Secondary | ICD-10-CM | POA: Diagnosis not present

## 2023-05-03 DIAGNOSIS — R7303 Prediabetes: Secondary | ICD-10-CM | POA: Diagnosis not present

## 2023-05-04 ENCOUNTER — Encounter: Payer: Self-pay | Admitting: Family Medicine

## 2023-05-04 ENCOUNTER — Ambulatory Visit (INDEPENDENT_AMBULATORY_CARE_PROVIDER_SITE_OTHER): Payer: BC Managed Care – PPO | Admitting: Family Medicine

## 2023-05-04 VITALS — BP 124/79 | HR 78 | Ht 70.0 in | Wt 259.3 lb

## 2023-05-04 DIAGNOSIS — R7303 Prediabetes: Secondary | ICD-10-CM

## 2023-05-04 DIAGNOSIS — E782 Mixed hyperlipidemia: Secondary | ICD-10-CM | POA: Insufficient documentation

## 2023-05-04 DIAGNOSIS — F411 Generalized anxiety disorder: Secondary | ICD-10-CM | POA: Diagnosis not present

## 2023-05-04 DIAGNOSIS — I10 Essential (primary) hypertension: Secondary | ICD-10-CM | POA: Diagnosis not present

## 2023-05-04 DIAGNOSIS — E559 Vitamin D deficiency, unspecified: Secondary | ICD-10-CM

## 2023-05-04 MED ORDER — CHLORTHALIDONE 25 MG PO TABS: 25.0000 mg | ORAL_TABLET | Freq: Every day | ORAL | 3 refills | Status: DC

## 2023-05-04 MED ORDER — LOSARTAN POTASSIUM 100 MG PO TABS: 100.0000 mg | ORAL_TABLET | Freq: Every day | ORAL | 3 refills | Status: DC

## 2023-05-04 NOTE — Assessment & Plan Note (Addendum)
Continue supplement Recheck level 

## 2023-05-04 NOTE — Assessment & Plan Note (Signed)
Reviewed last lipid panel Not currently on a statin Recheck FLP and CMP Discussed diet and exercise  

## 2023-05-04 NOTE — Progress Notes (Signed)
Established patient visit   Patient: Sean Kennedy   DOB: 04-09-1981   43 y.o. Male  MRN: [id number] Visit Date: 05/04/2023  Today's healthcare provider: Shirlee Latch, MD   Chief Complaint  Patient presents with   Medical Management of Chronic Issues    6 month follow-up. Patient last seen 10/31/22. Reports no symptoms of pre-diabetes. Patient is now taking otc Vitamin D supplement   Hypertension   Medication Refill    Hygroton 25 mg and Losartan 100 mg   Subjective    HPI HPI     Medical Management of Chronic Issues    Additional comments: 6 month follow-up. Patient last seen 10/31/22. Reports no symptoms of pre-diabetes. Patient is now taking otc Vitamin D supplement        Hypertension   The patient rarely exercises.        Medication Refill    Additional comments: Hygroton 25 mg and Losartan 100 mg      Last edited by Acey Lav, CMA on 05/04/2023  8:40 AM.        Taking meds regularly. No issues. Feeling well.     Medications: Outpatient Medications Prior to Visit  Medication Sig   phentermine (ADIPEX-P) 37.5 MG tablet Take 1 tablet (37.5 mg total) by mouth daily.   Vitamin D, Ergocalciferol, (DRISDOL) 1.25 MG (50000 UNIT) CAPS capsule Take 1 capsule (50,000 Units total) by mouth every 7 (seven) days.   [DISCONTINUED] chlorthalidone (HYGROTON) 25 MG tablet Take 1 tablet (25 mg total) by mouth daily.   [DISCONTINUED] losartan (COZAAR) 100 MG tablet Take 1 tablet (100 mg total) by mouth daily.   No facility-administered medications prior to visit.    Review of Systems     Objective    BP 124/79 (BP Location: Left Arm, Patient Position: Sitting, Cuff Size: Large)   Pulse 78   Ht 5\' 10"  (1.778 m)   Wt 259 lb 4.8 oz (117.6 kg)   SpO2 99%   BMI 37.21 kg/m    Physical Exam Vitals reviewed.  Constitutional:      General: He is not in acute distress.    Appearance: Normal appearance. He is not diaphoretic.  HENT:     Head:  Normocephalic and atraumatic.  Eyes:     General: No scleral icterus.    Conjunctiva/sclera: Conjunctivae normal.  Cardiovascular:     Rate and Rhythm: Normal rate and regular rhythm.     Heart sounds: Normal heart sounds. No murmur heard. Pulmonary:     Effort: Pulmonary effort is normal. No respiratory distress.     Breath sounds: Normal breath sounds. No wheezing or rhonchi.  Musculoskeletal:     Cervical back: Neck supple.     Right lower leg: No edema.     Left lower leg: No edema.  Lymphadenopathy:     Cervical: No cervical adenopathy.  Skin:    General: Skin is warm and dry.     Findings: No rash.  Neurological:     Mental Status: He is alert and oriented to person, place, and time. Mental status is at baseline.  Psychiatric:        Mood and Affect: Mood normal.        Behavior: Behavior normal.      No results found for any visits on 05/04/23.  Assessment & Plan     Problem List Items Addressed This Visit       Cardiovascular and Mediastinum   Essential hypertension -  Primary   Blood pressure well-controlled on current regimen. -Continue Chlorthalidone 25mg  daily and Losartan 100mg  daily. -Send refills to pharmacy.      Relevant Medications   chlorthalidone (HYGROTON) 25 MG tablet   losartan (COZAAR) 100 MG tablet   Other Relevant Orders   Comprehensive metabolic panel     Other   Pre-diabetes   Recommend low carb diet Recheck A1c       Relevant Orders   Hemoglobin A1c   Vitamin D deficiency   Continue supplement Recheck level       Relevant Orders   VITAMIN D 25 Hydroxy (Vit-D Deficiency, Fractures)   GAD (generalized anxiety disorder)   Chronic and stable Not currently on medications      Moderate mixed hyperlipidemia not requiring statin therapy   Reviewed last lipid panel Not currently on a statin Recheck FLP and CMP Discussed diet and exercise       Relevant Medications   chlorthalidone (HYGROTON) 25 MG tablet   losartan  (COZAAR) 100 MG tablet   Other Relevant Orders   Comprehensive metabolic panel   Lipid panel     Return in about 6 months (around 11/01/2023) for CPE.       Shirlee Latch, MD  [facility] [phone] (phone) [phone] (fax)  [facility]

## 2023-05-04 NOTE — Assessment & Plan Note (Signed)
Blood pressure well-controlled on current regimen. -Continue Chlorthalidone 25mg  daily and Losartan 100mg  daily. -Send refills to pharmacy.

## 2023-05-04 NOTE — Assessment & Plan Note (Signed)
Recommend low carb diet °Recheck A1c  °

## 2023-05-04 NOTE — Assessment & Plan Note (Signed)
Chronic and stable Not currently on medications

## 2023-05-05 LAB — COMPREHENSIVE METABOLIC PANEL
ALT: 70 [IU]/L — ABNORMAL HIGH (ref 0–44)
AST: 43 [IU]/L — ABNORMAL HIGH (ref 0–40)
Albumin: 4.5 g/dL (ref 4.1–5.1)
Alkaline Phosphatase: 69 [IU]/L (ref 44–121)
BUN/Creatinine Ratio: 9 (ref 9–20)
BUN: 12 mg/dL (ref 6–24)
Bilirubin Total: 0.4 mg/dL (ref 0.0–1.2)
CO2: 28 mmol/L (ref 20–29)
Calcium: 10 mg/dL (ref 8.7–10.2)
Chloride: 99 mmol/L (ref 96–106)
Creatinine, Ser: 1.34 mg/dL — ABNORMAL HIGH (ref 0.76–1.27)
Globulin, Total: 2.7 g/dL (ref 1.5–4.5)
Glucose: 94 mg/dL (ref 70–99)
Potassium: 4.1 mmol/L (ref 3.5–5.2)
Sodium: 141 mmol/L (ref 134–144)
Total Protein: 7.2 g/dL (ref 6.0–8.5)
eGFR: 68 mL/min/{1.73_m2} (ref >59)

## 2023-05-05 LAB — LIPID PANEL
Chol/HDL Ratio: 4.9 {ratio} (ref 0.0–5.0)
Cholesterol, Total: 183 mg/dL (ref 100–199)
HDL: 37 mg/dL — ABNORMAL LOW (ref >39)
LDL Chol Calc (NIH): 124 mg/dL — ABNORMAL HIGH (ref 0–99)
Triglycerides: 121 mg/dL (ref 0–149)
VLDL Cholesterol Cal: 22 mg/dL (ref 5–40)

## 2023-05-05 LAB — VITAMIN D 25 HYDROXY (VIT D DEFICIENCY, FRACTURES): Vit D, 25-Hydroxy: 45.9 ng/mL (ref 30.0–100.0)

## 2023-05-05 LAB — HEMOGLOBIN A1C
Est. average glucose Bld gHb Est-mCnc: 123 mg/dL
Hgb A1c MFr Bld: 5.9 % — ABNORMAL HIGH (ref 4.8–5.6)

## 2023-05-09 DIAGNOSIS — E291 Testicular hypofunction: Secondary | ICD-10-CM | POA: Diagnosis not present

## 2023-05-10 ENCOUNTER — Ambulatory Visit: Payer: BC Managed Care – PPO | Admitting: Podiatry

## 2023-05-10 ENCOUNTER — Encounter: Payer: Self-pay | Admitting: Podiatry

## 2023-05-10 ENCOUNTER — Ambulatory Visit (INDEPENDENT_AMBULATORY_CARE_PROVIDER_SITE_OTHER): Payer: BC Managed Care – PPO

## 2023-05-10 DIAGNOSIS — M2012 Hallux valgus (acquired), left foot: Secondary | ICD-10-CM | POA: Diagnosis not present

## 2023-05-10 DIAGNOSIS — Z9889 Other specified postprocedural states: Secondary | ICD-10-CM | POA: Diagnosis not present

## 2023-05-10 DIAGNOSIS — M960 Pseudarthrosis after fusion or arthrodesis: Secondary | ICD-10-CM | POA: Diagnosis not present

## 2023-05-10 DIAGNOSIS — M21612 Bunion of left foot: Secondary | ICD-10-CM

## 2023-05-10 NOTE — Progress Notes (Signed)
  Subjective:  Patient ID: Sean Kennedy, male    DOB: [birth year],  MRN: [id number]  Chief Complaint  Patient presents with   Routine Post Op    POV DOS: 05/27/2022  Lapidus bunionectomy and Akin, left foot "It's much better."    DOS: 05/27/2022 Procedure: Lapidus bunionectomy and Akin, left foot  43 y.o. male returns for post-op check.  He returns for follow-up he does not have any pain he does occasionally note some odd sensation to be in the first and second toe when he runs but it does not swelling more at all  Review of Systems: Negative except as noted in the HPI. Denies N/V/F/Ch.   Objective:   There were no vitals filed for this visit.  There is no height or weight on file to calculate BMI. Constitutional Well developed. Well nourished.  Vascular Foot warm and well perfused. Capillary refill normal to all digits.  Calf is soft and supple, no posterior calf or knee pain, negative Homans' sign  Neurologic Normal speech. Oriented to person, place, and time. Epicritic sensation to light touch grossly present bilaterally.  Dermatologic Incision is well healed not hypertrophic  Orthopedic: No edema pain or instability to manipulation of the first TMT joint.  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 Hallux valgus with bunions, left     Plan:  Patient was evaluated and treated and all questions answered.  S/p foot surgery left -Overall unchanged.  He may discontinue the bone stimulator at this point.  He has an asymptomatic partial union which we  discussed.  So far does not have any clinical symptomology of painful nonunion or loss of correction or deformity.  I discussed with him that he may continue all his normal activities as tolerated, if this does become symptomatic in the future and needs further revision if he is having pain swelling or recurrence of his bunion deformity he will return to see me for new radiographs as needed.   Return if symptoms worsen or fail to improve.

## 2023-05-19 DIAGNOSIS — E291 Testicular hypofunction: Secondary | ICD-10-CM | POA: Diagnosis not present

## 2023-05-23 DIAGNOSIS — E291 Testicular hypofunction: Secondary | ICD-10-CM | POA: Diagnosis not present

## 2023-05-23 DIAGNOSIS — Z79890 Hormone replacement therapy (postmenopausal): Secondary | ICD-10-CM | POA: Diagnosis not present

## 2023-05-25 DIAGNOSIS — M2550 Pain in unspecified joint: Secondary | ICD-10-CM | POA: Diagnosis not present

## 2023-05-25 DIAGNOSIS — E291 Testicular hypofunction: Secondary | ICD-10-CM | POA: Diagnosis not present

## 2023-05-25 DIAGNOSIS — Z6837 Body mass index (BMI) 37.0-37.9, adult: Secondary | ICD-10-CM | POA: Diagnosis not present

## 2023-06-01 DIAGNOSIS — E291 Testicular hypofunction: Secondary | ICD-10-CM | POA: Diagnosis not present

## 2023-06-09 DIAGNOSIS — E291 Testicular hypofunction: Secondary | ICD-10-CM | POA: Diagnosis not present

## 2023-06-20 DIAGNOSIS — R632 Polyphagia: Secondary | ICD-10-CM | POA: Diagnosis not present

## 2023-06-20 DIAGNOSIS — E559 Vitamin D deficiency, unspecified: Secondary | ICD-10-CM | POA: Diagnosis not present

## 2023-06-20 DIAGNOSIS — I10 Essential (primary) hypertension: Secondary | ICD-10-CM | POA: Diagnosis not present

## 2023-08-02 DIAGNOSIS — B349 Viral infection, unspecified: Secondary | ICD-10-CM | POA: Diagnosis not present

## 2023-08-02 DIAGNOSIS — R070 Pain in throat: Secondary | ICD-10-CM | POA: Diagnosis not present

## 2023-08-02 DIAGNOSIS — Z20822 Contact with and (suspected) exposure to covid-19: Secondary | ICD-10-CM | POA: Diagnosis not present

## 2023-08-02 DIAGNOSIS — H1013 Acute atopic conjunctivitis, bilateral: Secondary | ICD-10-CM | POA: Diagnosis not present

## 2023-08-10 ENCOUNTER — Other Ambulatory Visit: Payer: Self-pay

## 2023-08-10 MED ORDER — WEGOVY 0.25 MG/0.5ML ~~LOC~~ SOAJ
0.2500 mg | SUBCUTANEOUS | 1 refills | Status: DC
  Filled 2023-08-10 – 2023-08-26 (×2): qty 2, 28d supply, fill #0

## 2023-08-26 ENCOUNTER — Other Ambulatory Visit: Payer: Self-pay

## 2023-08-27 ENCOUNTER — Other Ambulatory Visit: Payer: Self-pay

## 2023-08-28 ENCOUNTER — Other Ambulatory Visit: Payer: Self-pay

## 2023-08-29 ENCOUNTER — Other Ambulatory Visit: Payer: Self-pay

## 2023-08-30 ENCOUNTER — Other Ambulatory Visit: Payer: Self-pay

## 2023-08-31 ENCOUNTER — Other Ambulatory Visit: Payer: Self-pay

## 2023-09-04 ENCOUNTER — Other Ambulatory Visit: Payer: Self-pay

## 2023-09-21 ENCOUNTER — Other Ambulatory Visit: Payer: Self-pay

## 2023-09-21 MED ORDER — ZEPBOUND 15 MG/0.5ML ~~LOC~~ SOAJ
15.0000 mg | SUBCUTANEOUS | 1 refills | Status: DC
  Filled 2023-09-21: qty 2, 28d supply, fill #0

## 2023-11-02 ENCOUNTER — Encounter: Payer: Self-pay | Admitting: Family Medicine

## 2023-11-02 ENCOUNTER — Ambulatory Visit (INDEPENDENT_AMBULATORY_CARE_PROVIDER_SITE_OTHER): Payer: Self-pay | Admitting: Family Medicine

## 2023-11-02 VITALS — BP 132/86 | HR 83 | Resp 16 | Ht 68.0 in | Wt 253.0 lb

## 2023-11-02 DIAGNOSIS — R7303 Prediabetes: Secondary | ICD-10-CM

## 2023-11-02 DIAGNOSIS — I10 Essential (primary) hypertension: Secondary | ICD-10-CM | POA: Diagnosis not present

## 2023-11-02 DIAGNOSIS — Z789 Other specified health status: Secondary | ICD-10-CM

## 2023-11-02 DIAGNOSIS — E559 Vitamin D deficiency, unspecified: Secondary | ICD-10-CM | POA: Diagnosis not present

## 2023-11-02 DIAGNOSIS — Z0000 Encounter for general adult medical examination without abnormal findings: Secondary | ICD-10-CM

## 2023-11-02 DIAGNOSIS — E782 Mixed hyperlipidemia: Secondary | ICD-10-CM | POA: Diagnosis not present

## 2023-11-02 MED ORDER — LOSARTAN POTASSIUM 100 MG PO TABS: 100.0000 mg | ORAL_TABLET | Freq: Every day | ORAL | 3 refills | Status: AC

## 2023-11-02 MED ORDER — CHLORTHALIDONE 25 MG PO TABS: 25.0000 mg | ORAL_TABLET | Freq: Every day | ORAL | 3 refills | Status: AC

## 2023-11-02 NOTE — Progress Notes (Signed)
 Complete physical exam   Patient: Sean Kennedy   DOB: 20-Mar-1981   42 y.o. Male  MRN: [id number] Visit Date: 11/02/2023  Today's healthcare provider: Jon Eva, MD   Chief Complaint  Patient presents with   Annual Exam    Patient reports feeling well. He is exercising some. He is sleeping well.   Subjective    Sean Kennedy is a 43 y.o. male who presents today for a complete physical exam.    Discussed the use of AI scribe software for clinical note transcription with the patient, who gave verbal consent to proceed.  History of Present Illness   Sean Kennedy is a 43 year old male who presents for an annual physical exam.  He takes chlorthalidone  25 mg daily, losartan  100 mg daily, and Zepbound  15 mg weekly for hypertension, prediabetes, and hyperlipidemia. He previously took a high-dose vitamin D  supplement but now takes a lower daily dose. He discontinued vitamin D  and other supplements like turmeric, ginger, grape seed oil, and beet due to redness and puffiness in one eye, which resolved after stopping these supplements.  He experiences congestion, possibly from a summer cold, and uses DayQuil. His hearing was temporarily affected by the cold but has since improved. He is considering biotin for hair and nails but is cautious due to previous reactions.        Last depression screening scores    11/02/2023    8:37 AM 10/31/2022    8:46 AM 05/09/2022    9:44 AM  PHQ 2/9 Scores  PHQ - 2 Score 0 0 0  PHQ- 9 Score   0   Last fall risk screening    11/02/2023    8:36 AM  Fall Risk   Falls in the past year? 0  Number falls in past yr: 0  Injury with Fall? 0  Risk for fall due to : No Fall Risks        Medications: Outpatient Medications Prior to Visit  Medication Sig   tirzepatide  (ZEPBOUND ) 15 MG/0.5ML Pen Inject 15 mg into the skin once a week.   [DISCONTINUED] chlorthalidone  (HYGROTON ) 25 MG tablet Take 1 tablet (25 mg total) by mouth daily.    [DISCONTINUED] losartan  (COZAAR ) 100 MG tablet Take 1 tablet (100 mg total) by mouth daily.   [DISCONTINUED] phentermine  (ADIPEX-P ) 37.5 MG tablet Take 1 tablet (37.5 mg total) by mouth daily.   [DISCONTINUED] Semaglutide -Weight Management (WEGOVY ) 0.25 MG/0.5ML SOAJ Inject 0.25 mg into the skin once a week.   [DISCONTINUED] Vitamin D , Ergocalciferol , (DRISDOL ) 1.25 MG (50000 UNIT) CAPS capsule Take 1 capsule (50,000 Units total) by mouth every 7 (seven) days.   No facility-administered medications prior to visit.    Review of Systems    Objective    BP 132/86 (BP Location: Left Arm, Patient Position: Sitting, Cuff Size: Large)   Pulse 83   Resp 16   Ht 5' 8 (1.727 m)   Wt 253 lb (114.8 kg)   SpO2 97%   BMI 38.47 kg/m    Physical Exam Vitals reviewed.  Constitutional:      General: He is not in acute distress.    Appearance: Normal appearance. He is well-developed. He is not diaphoretic.  HENT:     Head: Normocephalic and atraumatic.     Right Ear: Tympanic membrane, ear canal and external ear normal.     Left Ear: Tympanic membrane, ear canal and external ear normal.     Nose:  Nose normal.     Mouth/Throat:     Mouth: Mucous membranes are moist.     Pharynx: Oropharynx is clear. No oropharyngeal exudate.  Eyes:     General: No scleral icterus.    Conjunctiva/sclera: Conjunctivae normal.     Pupils: Pupils are equal, round, and reactive to light.  Neck:     Thyroid : No thyromegaly.  Cardiovascular:     Rate and Rhythm: Normal rate and regular rhythm.     Heart sounds: Normal heart sounds. No murmur heard. Pulmonary:     Effort: Pulmonary effort is normal. No respiratory distress.     Breath sounds: Normal breath sounds. No wheezing or rales.  Abdominal:     General: There is no distension.     Palpations: Abdomen is soft.     Tenderness: There is no abdominal tenderness.  Musculoskeletal:        General: No deformity.     Cervical back: Neck supple.     Right  lower leg: No edema.     Left lower leg: No edema.  Lymphadenopathy:     Cervical: No cervical adenopathy.  Skin:    General: Skin is warm and dry.     Findings: No rash.  Neurological:     Mental Status: He is alert and oriented to person, place, and time. Mental status is at baseline.     Gait: Gait normal.  Psychiatric:        Mood and Affect: Mood normal.        Behavior: Behavior normal.        Thought Content: Thought content normal.      No results found for any visits on 11/02/23.  Assessment & Plan    Routine Health Maintenance and Physical Exam  Exercise Activities and Dietary recommendations  Goals   None     Immunization History  Administered Date(s) Administered   PFIZER(Purple Top)SARS-COV-2 Vaccination 07/06/2019, 07/30/2019, 01/30/2020   Tdap 03/11/2019    Health Maintenance  Topic Date Due   Hepatitis B Vaccines (1 of 3 - 19+ 3-dose series) Never done   HPV VACCINES (1 - 3-dose SCDM series) Never done   COVID-19 Vaccine (4 - 2024-25 season) 12/18/2022   INFLUENZA VACCINE  11/17/2023   DTaP/Tdap/Td (2 - Td or Tdap) 03/10/2029   Hepatitis C Screening  Completed   HIV Screening  Completed   Meningococcal B Vaccine  Aged Out    Discussed health benefits of physical activity, and encouraged him to engage in regular exercise appropriate for his age and condition.  Problem List Items Addressed This Visit       Cardiovascular and Mediastinum   Essential hypertension   Hypertension is well-controlled on current medication regimen. - Continue current antihypertensive medications: chlorthalidone  25 mg daily and losartan  100 mg daily - Ensure refills of blood pressure medications are available at the pharmacy - Schedule six-month follow-up for blood pressure monitoring      Relevant Medications   chlorthalidone  (HYGROTON ) 25 MG tablet   losartan  (COZAAR ) 100 MG tablet   Other Relevant Orders   Comprehensive metabolic panel with GFR     Other    Pre-diabetes   Prediabetes is being monitored. - Order A1c test to monitor prediabetes      Relevant Orders   Hemoglobin A1c   Vitamin D  deficiency   Vitamin D  deficiency is being managed with over-the-counter vitamin D  supplementation. - Order vitamin D  level test to monitor supplementation adequacy  Relevant Orders   VITAMIN D  25 Hydroxy (Vit-D Deficiency, Fractures)   Moderate mixed hyperlipidemia not requiring statin therapy   Hyperlipidemia is being managed. - Order cholesterol test to monitor hyperlipidemia      Relevant Medications   chlorthalidone  (HYGROTON ) 25 MG tablet   losartan  (COZAAR ) 100 MG tablet   Other Relevant Orders   Comprehensive metabolic panel with GFR   Lipid panel   Other Visit Diagnoses       Encounter for annual physical exam    -  Primary   Relevant Orders   Comprehensive metabolic panel with GFR   Hemoglobin A1c   Hepatitis B Surface AntiBODY   Lipid panel   VITAMIN D  25 Hydroxy (Vit-D Deficiency, Fractures)     Hepatitis B vaccination status unknown       Relevant Orders   Hepatitis B Surface AntiBODY           Adult Wellness Visit Routine adult wellness visit with discussion on immunization history and future screenings. - Order hepatitis immunity blood test to check for hepatitis B immunity - Schedule colon cancer screening at age 60 - Administer flu shot when in season  Acute upper respiratory infection Mild congestion likely due to a summer cold. No significant concerns during examination.  Nonscarring hair loss Nonscarring hair loss discussed with previous biotin supplementation causing possible allergic reactions. Alternative treatments such as Rogaine discussed. - Consider using Rogaine topical foam for hair loss management       Return in about 6 months (around 05/04/2024) for chronic disease f/u.     Jon Eva, MD  [facility] [phone] (phone) [phone] (fax)  [facility]

## 2023-11-02 NOTE — Assessment & Plan Note (Signed)
 Vitamin D  deficiency is being managed with over-the-counter vitamin D  supplementation. - Order vitamin D  level test to monitor supplementation adequacy

## 2023-11-02 NOTE — Assessment & Plan Note (Signed)
 Prediabetes is being monitored. - Order A1c test to monitor prediabetes

## 2023-11-02 NOTE — Assessment & Plan Note (Signed)
 Hyperlipidemia is being managed. - Order cholesterol test to monitor hyperlipidemia

## 2023-11-02 NOTE — Assessment & Plan Note (Signed)
 Hypertension is well-controlled on current medication regimen. - Continue current antihypertensive medications: chlorthalidone  25 mg daily and losartan  100 mg daily - Ensure refills of blood pressure medications are available at the pharmacy - Schedule six-month follow-up for blood pressure monitoring

## 2023-11-03 LAB — COMPREHENSIVE METABOLIC PANEL WITH GFR
ALT: 25 IU/L (ref 0–44)
AST: 31 IU/L (ref 0–40)
Albumin: 4.6 g/dL (ref 4.1–5.1)
Alkaline Phosphatase: 61 IU/L (ref 44–121)
BUN/Creatinine Ratio: 11 (ref 9–20)
BUN: 14 mg/dL (ref 6–24)
Bilirubin Total: 0.3 mg/dL (ref 0.0–1.2)
CO2: 23 mmol/L (ref 20–29)
Calcium: 9.6 mg/dL (ref 8.7–10.2)
Chloride: 98 mmol/L (ref 96–106)
Creatinine, Ser: 1.32 mg/dL — ABNORMAL HIGH (ref 0.76–1.27)
Globulin, Total: 2.3 g/dL (ref 1.5–4.5)
Glucose: 89 mg/dL (ref 70–99)
Potassium: 3.6 mmol/L (ref 3.5–5.2)
Sodium: 139 mmol/L (ref 134–144)
Total Protein: 6.9 g/dL (ref 6.0–8.5)
eGFR: 69 mL/min/1.73 (ref >59)

## 2023-11-03 LAB — LIPID PANEL
Chol/HDL Ratio: 4.2 ratio (ref 0.0–5.0)
Cholesterol, Total: 143 mg/dL (ref 100–199)
HDL: 34 mg/dL — ABNORMAL LOW (ref >39)
LDL Chol Calc (NIH): 93 mg/dL (ref 0–99)
Triglycerides: 85 mg/dL (ref 0–149)
VLDL Cholesterol Cal: 16 mg/dL (ref 5–40)

## 2023-11-03 LAB — HEMOGLOBIN A1C
Est. average glucose Bld gHb Est-mCnc: 117 mg/dL
Hgb A1c MFr Bld: 5.7 % — ABNORMAL HIGH (ref 4.8–5.6)

## 2023-11-03 LAB — HEPATITIS B SURFACE ANTIBODY,QUALITATIVE: Hep B Surface Ab, Qual: NONREACTIVE

## 2023-11-03 LAB — VITAMIN D 25 HYDROXY (VIT D DEFICIENCY, FRACTURES): Vit D, 25-Hydroxy: 71.4 ng/mL (ref 30.0–100.0)

## 2023-11-06 ENCOUNTER — Ambulatory Visit: Payer: Self-pay | Admitting: Family Medicine

## 2024-05-14 ENCOUNTER — Ambulatory Visit: Admitting: Family Medicine

## 2024-05-23 ENCOUNTER — Ambulatory Visit: Admitting: Family Medicine

## 2024-05-23 ENCOUNTER — Encounter: Payer: Self-pay | Admitting: Family Medicine

## 2024-05-23 VITALS — BP 126/88 | HR 69 | Resp 14 | Ht 68.0 in | Wt 266.6 lb

## 2024-05-23 DIAGNOSIS — D1722 Benign lipomatous neoplasm of skin and subcutaneous tissue of left arm: Secondary | ICD-10-CM | POA: Insufficient documentation

## 2024-05-23 DIAGNOSIS — I10 Essential (primary) hypertension: Secondary | ICD-10-CM

## 2024-05-23 DIAGNOSIS — R7303 Prediabetes: Secondary | ICD-10-CM

## 2024-05-23 DIAGNOSIS — E782 Mixed hyperlipidemia: Secondary | ICD-10-CM

## 2024-05-23 MED ORDER — TIRZEPATIDE-WEIGHT MANAGEMENT 2.5 MG/0.5ML ~~LOC~~ SOLN: 2.5000 mg | SUBCUTANEOUS | 0 refills | Status: AC

## 2024-05-23 MED ORDER — TIRZEPATIDE-WEIGHT MANAGEMENT 5 MG/0.5ML ~~LOC~~ SOLN: 5.0000 mg | SUBCUTANEOUS | 2 refills | Status: AC

## 2024-05-23 NOTE — Progress Notes (Signed)
 "  Established Patient Office Visit  Subjective   Patient ID: Sean Kennedy, male    DOB: [birth year]  Age: 44 y.o. MRN: [id number]  Chief Complaint  Patient presents with   Hypertension    Monitors at home- numbers goes up and down   Medical Management of Chronic Issues    6 month follow up Would like to go back on zepbound  its been about 2-3 months not taking.   Mass    Pt notices a lump on L rib/side, L upper arm ongoing for a few weeks not bothering    Sean Kennedy is a 44 y/o presenting for chronic illness follow-up. He monitors his blood pressure at home which ranges from 120's-130's. No side effects from medications. He stopped taking zepbound  due to cost but now interested in starting again. Also concerned about lumps noticed by wife on left upper arm and on left side of chest. Denies pain or changing in size.   Hypertension      ROS    Objective:     BP 126/88   Pulse 69   Resp 14   Ht 5' 8 (1.727 m)   Wt 266 lb 9.6 oz (120.9 kg)   SpO2 99%   BMI 40.54 kg/m  BP Readings from Last 3 Encounters:  05/23/24 126/88  11/02/23 132/86  05/04/23 124/79   Wt Readings from Last 3 Encounters:  05/23/24 266 lb 9.6 oz (120.9 kg)  11/02/23 253 lb (114.8 kg)  05/04/23 259 lb 4.8 oz (117.6 kg)      Physical Exam Constitutional:      Appearance: Normal appearance.  Cardiovascular:     Rate and Rhythm: Normal rate and regular rhythm.     Pulses: Normal pulses.     Heart sounds: Normal heart sounds.  Pulmonary:     Effort: Pulmonary effort is normal.     Breath sounds: Normal breath sounds.  Skin:    Comments: Small firm, mobile, well-demarcated nodule on left bicep region and left side of chest under pectoris muscle.  Neurological:     Mental Status: He is alert.      No results found for any visits on 05/23/24.    The 10-year ASCVD risk score (Arnett DK, et al., 2019) is: 6.1%    Assessment & Plan:   Problem List Items Addressed This Visit        Cardiovascular and Mediastinum   Essential hypertension - Primary   Well-controlled with chorthalidone 79m and losartan  100mg  daily. Monitors blood pressure readings at home ranging within 120's-130's. BP reading today 126/88. - Continue current antihypertensive regimen - Check CMP - Schedule six-month follow-up for continued monitoring      Relevant Orders   Comprehensive metabolic panel with GFR     Other   Morbid obesity (HCC)   Previously on Zepbound  up to 15mg  but discontinued due to cost. Discussed restarting at lower dose.       Relevant Medications   tirzepatide  (ZEPBOUND ) 2.5 MG/0.5ML injection vial   tirzepatide  5 MG/0.5ML injection vial   Pre-diabetes   Prediabetes is being monitored. Last A1c 5.7 improved from 5.9. Order A1C today to assess for further management.      Relevant Orders   Hemoglobin A1c   Moderate mixed hyperlipidemia not requiring statin therapy   Currently managed with diet and exercise. Last LDL of 93 improved from 124. Recheck lipid panel for further monitoring.       Relevant Orders   Comprehensive metabolic  panel with GFR   Lipid panel   Lipoma of left upper extremity   Patient noticed nodule on left bicep and on left side of chest. On exam nodule is small, firm, and mobile. Non-tender on palpation and has not changed in size. Discussed that this is likely a lipoma and is benign. - Will continue to monitor for nay changes       Return in about 3 months (around 08/20/2024) for chronic disease f/u with Hep B#2.    Kamryn J Mills, Medical Student  Patient seen along with MS3 student, Johnsie Barefoot. I personally evaluated this patient along with the student, and verified all aspects of the history, physical exam, and medical decision making as documented by the student. I agree with the student's documentation and have made all necessary edits.  Shanna Un, Jon HERO, MD, MPH [facility]    "

## 2024-05-23 NOTE — Assessment & Plan Note (Signed)
 Currently managed with diet and exercise. Last LDL of 93 improved from 124. Recheck lipid panel for further monitoring.

## 2024-05-23 NOTE — Assessment & Plan Note (Signed)
 Prediabetes is being monitored. Last A1c 5.7 improved from 5.9. Order A1C today to assess for further management.

## 2024-05-23 NOTE — Assessment & Plan Note (Signed)
 Well-controlled with chorthalidone 39m and losartan  100mg  daily. Monitors blood pressure readings at home ranging within 120's-130's. BP reading today 126/88. - Continue current antihypertensive regimen - Check CMP - Schedule six-month follow-up for continued monitoring

## 2024-05-23 NOTE — Assessment & Plan Note (Signed)
 Patient noticed nodule on left bicep and on left side of chest. On exam nodule is small, firm, and mobile. Non-tender on palpation and has not changed in size. Discussed that this is likely a lipoma and is benign. - Will continue to monitor for nay changes

## 2024-05-23 NOTE — Assessment & Plan Note (Signed)
 Previously on Zepbound  up to 15mg  but discontinued due to cost. Discussed restarting at lower dose.

## 2024-05-24 LAB — LIPID PANEL
Chol/HDL Ratio: 4 ratio (ref 0.0–5.0)
Cholesterol, Total: 166 mg/dL (ref 100–199)
HDL: 42 mg/dL
LDL Chol Calc (NIH): 111 mg/dL — ABNORMAL HIGH (ref 0–99)
Triglycerides: 68 mg/dL (ref 0–149)
VLDL Cholesterol Cal: 13 mg/dL (ref 5–40)

## 2024-05-24 LAB — COMPREHENSIVE METABOLIC PANEL WITH GFR
ALT: 34 [IU]/L (ref 0–44)
AST: 37 [IU]/L (ref 0–40)
Albumin: 4.3 g/dL (ref 4.1–5.1)
Alkaline Phosphatase: 63 [IU]/L (ref 47–123)
BUN/Creatinine Ratio: 9 (ref 9–20)
BUN: 12 mg/dL (ref 6–24)
Bilirubin Total: 0.3 mg/dL (ref 0.0–1.2)
CO2: 24 mmol/L (ref 20–29)
Calcium: 9.8 mg/dL (ref 8.7–10.2)
Chloride: 98 mmol/L (ref 96–106)
Creatinine, Ser: 1.39 mg/dL — ABNORMAL HIGH (ref 0.76–1.27)
Globulin, Total: 2.9 g/dL (ref 1.5–4.5)
Glucose: 80 mg/dL (ref 70–99)
Potassium: 3.8 mmol/L (ref 3.5–5.2)
Sodium: 138 mmol/L (ref 134–144)
Total Protein: 7.2 g/dL (ref 6.0–8.5)
eGFR: 65 mL/min/{1.73_m2}

## 2024-05-24 LAB — HEMOGLOBIN A1C
Est. average glucose Bld gHb Est-mCnc: 123 mg/dL
Hgb A1c MFr Bld: 5.9 % — ABNORMAL HIGH (ref 4.8–5.6)

## 2024-06-13 ENCOUNTER — Ambulatory Visit: Admitting: Family Medicine

## 2024-08-20 ENCOUNTER — Ambulatory Visit: Admitting: Family Medicine
